# Patient Record
Sex: Female | Born: 1955 | Race: Asian | Hispanic: No | Marital: Married | Smoking: Never smoker
Health system: Southern US, Community
[De-identification: ages and names within clinical notes are randomized; demographics above are authoritative.]

## PROBLEM LIST (undated history)

## (undated) DIAGNOSIS — D649 Anemia, unspecified: Secondary | ICD-10-CM

## (undated) DIAGNOSIS — I10 Essential (primary) hypertension: Secondary | ICD-10-CM

## (undated) DIAGNOSIS — M25562 Pain in left knee: Secondary | ICD-10-CM

## (undated) DIAGNOSIS — M25561 Pain in right knee: Secondary | ICD-10-CM

## (undated) HISTORY — DX: Pain in right knee: M25.562

## (undated) HISTORY — PX: NO PAST SURGERIES: SHX2092

## (undated) HISTORY — DX: Anemia, unspecified: D64.9

## (undated) HISTORY — DX: Pain in right knee: M25.561

---

## 2010-07-05 ENCOUNTER — Emergency Department (HOSPITAL_COMMUNITY)
Admission: EM | Admit: 2010-07-05 | Discharge: 2010-07-05 | Payer: Self-pay | Source: Home / Self Care | Admitting: Emergency Medicine

## 2013-10-16 ENCOUNTER — Encounter (HOSPITAL_COMMUNITY): Payer: Self-pay | Admitting: Emergency Medicine

## 2013-10-16 ENCOUNTER — Emergency Department (HOSPITAL_COMMUNITY)
Admission: EM | Admit: 2013-10-16 | Discharge: 2013-10-16 | Disposition: A | Discharge status: Home / Self Care | Payer: Self-pay | Source: Home / Self Care | Attending: Emergency Medicine | Admitting: Emergency Medicine

## 2013-10-16 DIAGNOSIS — I10 Essential (primary) hypertension: Secondary | ICD-10-CM

## 2013-10-16 HISTORY — DX: Essential (primary) hypertension: I10

## 2013-10-16 LAB — COMPREHENSIVE METABOLIC PANEL
ALT: 8 U/L (ref 0–35)
Albumin: 4.4 g/dL (ref 3.5–5.2)
Alkaline Phosphatase: 66 U/L (ref 39–117)
CO2: 29 mEq/L (ref 19–32)
Creatinine, Ser: 0.78 mg/dL (ref 0.50–1.10)
GFR calc Af Amer: >90 mL/min (ref >90)
GFR calc non Af Amer: >90 mL/min (ref >90)
Glucose, Bld: 87 mg/dL (ref 70–99)
Sodium: 139 mEq/L (ref 135–145)
Total Bilirubin: 0.9 mg/dL (ref 0.3–1.2)

## 2013-10-16 LAB — CBC WITH DIFFERENTIAL/PLATELET
Basophils Relative: 0 % (ref 0–1)
HCT: 36.6 % (ref 36.0–46.0)
MCH: 25.3 pg — ABNORMAL LOW (ref 26.0–34.0)
Neutro Abs: 3.5 10*3/uL (ref 1.7–7.7)
RBC: 4.91 MIL/uL (ref 3.87–5.11)

## 2013-10-16 LAB — POCT H PYLORI SCREEN: H. PYLORI SCREEN, POC: NEGATIVE

## 2013-10-16 NOTE — ED Notes (Addendum)
Via 57 y/o daughter, Lenn Cal, and Runner, broadcasting/film/video of pt... Pt c/o HTN Reports she was referred to [place] by the [facility]reg[facility] Nurse for HBP BP today is 197/109... Sxs today include: BV, abd pain, HA Denies: CP, SOB, weakness, diaphoresis, f/v/n/d.... No PCP Alert w/no signs of acute distress.

## 2013-10-16 NOTE — ED Provider Notes (Signed)
CSN: [id number]     Arrival date & time 10/16/13  1231 History   First MD Initiated Contact with Patient 10/16/13 1348     Chief Complaint  Patient presents with  . Hypertension   (Consider location/radiation/quality/duration/timing/severity/associated sxs/prior Treatment) HPI Comments: 57 year old non-English-speaking female presents for evaluation of hypertension. She saw a Engineer, water today who checked her blood pressure and then sent her here. Today, she is complaining of headache, abdominal pain, blurry vision. These have all been present for over a year and have not changed at all recently. No other symptoms   Past Medical History  Diagnosis Date  . Hypertension    History reviewed. No pertinent past surgical history. No family history on file. History  Substance Use Topics  . Smoking status: Never Smoker   . Smokeless tobacco: Not on file  . Alcohol Use: No   OB History   Grav Para Term Preterm Abortions TAB SAB Ect Mult Living                 Review of Systems  Constitutional: Negative for fever and chills.  Eyes: Positive for visual disturbance.  Respiratory: Negative for cough and shortness of breath.   Cardiovascular: Negative for chest pain, palpitations and leg swelling.  Gastrointestinal: Positive for abdominal pain. Negative for nausea and vomiting.  Endocrine: Negative for polydipsia and polyuria.  Genitourinary: Negative for dysuria, urgency and frequency.  Musculoskeletal: Negative for arthralgias and myalgias.  Skin: Negative for rash.  Neurological: Positive for headaches. Negative for dizziness, weakness and light-headedness.    Allergies  Review of patient's allergies indicates no known allergies.  Home Medications  No current outpatient prescriptions on file. BP 197/109  Pulse 69  Temp(Src) 98.3 F (36.8 C) (Oral)  Resp 16  SpO2 100% Physical Exam  Nursing note and vitals reviewed. Constitutional: She is oriented to person, place,  and time. Vital signs are normal. She appears well-developed and well-nourished. No distress.  HENT:  Head: Normocephalic and atraumatic.  Eyes: Scleral icterus is present.  Cardiovascular: Normal rate, regular rhythm and normal heart sounds.   Pulmonary/Chest: Effort normal and breath sounds normal. No respiratory distress. She has no wheezes. She has no rales.  Abdominal: Soft.  Neurological: She is alert and oriented to person, place, and time. She has normal strength. No cranial nerve deficit. Coordination normal.  Skin: Skin is warm and dry. No rash noted. She is not diaphoretic.  Psychiatric: She has a normal mood and affect. Judgment normal.    ED Course  Procedures (including critical care time) Labs Review Labs Reviewed  CBC WITH DIFFERENTIAL  COMPREHENSIVE METABOLIC PANEL  POCT H PYLORI SCREEN   Imaging Review No results found.    MDM   1. Hypertension    Blood pressure is very high the patient is comfortable, in no distress. She is not sick at this time, this is a normal day for her. No signs of hypertensive urgency or emergency. The best thing we can do for this patient at this time is get her set up with primary care. I provided explicit instructions, call community health and wellness this afternoon to make an appointment. CBC and CMP sent, we will call back for cautery external nurse who will get in touch with this patient's family if the labs are abnormal and require anything other than followup with primary care.      Graylon Good, PA-C 10/16/13 1521

## 2013-10-16 NOTE — ED Provider Notes (Signed)
Medical screening examination/treatment/procedure(s) were performed by non-physician practitioner and as supervising physician I was immediately available for consultation/collaboration.  Jahmir Salo, M.D.  Khrystian Schauf C Eleftheria Taborn, MD 10/16/13 1914 

## 2013-10-28 ENCOUNTER — Ambulatory Visit (INDEPENDENT_AMBULATORY_CARE_PROVIDER_SITE_OTHER): Payer: Self-pay | Admitting: Family Medicine

## 2013-10-28 VITALS — BP 135/88 | HR 68 | Temp 98.1°F | Ht 61.25 in | Wt 106.0 lb

## 2013-10-28 DIAGNOSIS — I10 Essential (primary) hypertension: Secondary | ICD-10-CM

## 2013-10-28 DIAGNOSIS — R519 Headache, unspecified: Secondary | ICD-10-CM | POA: Insufficient documentation

## 2013-10-28 DIAGNOSIS — R51 Headache: Secondary | ICD-10-CM | POA: Insufficient documentation

## 2013-10-28 DIAGNOSIS — K3189 Other diseases of stomach and duodenum: Secondary | ICD-10-CM

## 2013-10-28 DIAGNOSIS — R109 Unspecified abdominal pain: Secondary | ICD-10-CM | POA: Insufficient documentation

## 2013-10-28 MED ORDER — CLONIDINE HCL 0.1 MG PO TABS
0.2000 mg | ORAL_TABLET | Freq: Once | ORAL | Status: AC
  Administered 2013-10-28: 0.2 mg via ORAL

## 2013-10-28 MED ORDER — OMEPRAZOLE 40 MG PO CPDR: 40.0000 mg | DELAYED_RELEASE_CAPSULE | Freq: Every day | ORAL | Status: DC

## 2013-10-28 MED ORDER — LISINOPRIL 5 MG PO TABS: 5.0000 mg | ORAL_TABLET | Freq: Every day | ORAL | Status: DC

## 2013-10-28 NOTE — Assessment & Plan Note (Addendum)
BP today SBP 190-220 and DBP 100-130 SYmptomatic w/ HA Longstanding untreated HTN Labs reviewed CLonidine 0.2mg  in office today w/ excellent reduction in BP (likely naive)  BP Readings from Last 3 Encounters:  10/28/13 135/88  10/16/13 197/109   Starting lisinopril 5mg   Recheck BMET in 1 month Likely to need multiple agents to control

## 2013-10-28 NOTE — Assessment & Plan Note (Signed)
Likely mixed picture given fmhx and length of HA. Migraines likely exacerbated by HTN No evidence for intracranial process at this time BP control Tylenol 1000mg  QID PRN

## 2013-10-28 NOTE — Patient Instructions (Addendum)
You are doing very well overall. We need to get your blood pressure under control as this likely is making your headache worse.  Please try tylenol 1000mg  up to 4 times a day for your headache Please try the prilosec for 2 weeks for your stomach pain Please return in 4 weeks for your next visit

## 2013-10-28 NOTE — Assessment & Plan Note (Signed)
Likely related to PUD given high stress after moving to [place] and symptomatic w/ acidic/hot foods  HPylori neg  2 wk trial of Prilosec.

## 2013-10-28 NOTE — Progress Notes (Addendum)
Laura Harris is a 57 y.o. female who presents to [facility] today for initiation of care to [facility]:    Presents with friend from the community and interpreter.  Health screening revealed very high blood pressure.  Never been treated.    BP: Couldn't afford any medications for her BP. Has never taken medications for her BP. Never checks BP outside of a MD office. Previous medical records indicate nml pressure in 2011 but elevated to >160/120 in November 2014. Currently moving, financially strapped, and very concerned about current living situation.   Stomach discomfort:daily BM. Worse after spicy foods and coffee. Recent H.Pylori test negative. Denies hemetemesis and hematochezia.   HA: ASA 650 x 2 days for HA. Chronic condition for pt but now occuring w/ greater frequency. Occur approximately daily since 2011 when arrived in [place]. Previous episodes of daily HA in [place] which resolve. Endorses photosensitivity. Denies phonosensitivity. Associated w/ blurry vision. ASA 650mg  w/o benefit. Wakes up at night. Denies seizures and LOC.  The following portions of the patient's history were reviewed and updated as appropriate: allergies, current medications, past medical history, family and social history, and problem list.  Patient is a nonsmoker.  Past Medical History  Diagnosis Date  . Hypertension     ROS as above otherwise neg.    Medications reviewed. Current Outpatient Prescriptions  Medication Sig Dispense Refill  . aspirin 325 MG tablet Take 650 mg by mouth daily.      Marland Kitchen lisinopril (PRINIVIL,ZESTRIL) 5 MG tablet Take 1 tablet (5 mg total) by mouth daily.  90 tablet  3  . omeprazole (PRILOSEC) 40 MG capsule Take 1 capsule (40 mg total) by mouth daily.  14 capsule  0   No current facility-administered medications for this visit.    Exam: BP 135/88  Pulse 68  Temp(Src) 98.1 F (36.7 C) (Oral)  Ht 5' 1.25" (1.556 m)  Wt 106 lb (48.081 kg)  BMI 19.86 kg/m2 Gen: Well NAD HEENT: EOMI,   MMM, optic disc margins sharp, retinal arteries w/o pulsation.  Lungs: CTABL Nl WOB Heart: RRR no MRG Abd: NABS, NT, ND Exts: Non edematous BL  LE, warm and well perfused.   No results found for this or any previous visit (from the past 72 hour(s)).  A/P (as seen in Problem list)  Malignant hypertension BP today SBP 190-220 and DBP 100-130 SYmptomatic w/ HA Longstanding untreated HTN Labs reviewed CLonidine 0.2mg  in office today w/ excellent reduction in BP (likely naive)  BP Readings from Last 3 Encounters:  10/28/13 135/88  10/16/13 197/109   Starting lisinopril 5mg   Recheck BMET in 1 month Likely to need multiple agents to control   Stomach pain Likely related to PUD given high stress after moving to [place] and symptomatic w/ acidic/hot foods  HPylori neg  2 wk trial of Prilosec.  Headache(784.0) Likely mixed picture given fmhx and length of HA. Migraines likely exacerbated by HTN No evidence for intracranial process at this time BP control Tylenol 1000mg  QID PRN

## 2013-11-04 ENCOUNTER — Encounter: Payer: Self-pay | Admitting: Family Medicine

## 2013-11-08 ENCOUNTER — Ambulatory Visit: Payer: Self-pay | Admitting: Internal Medicine

## 2013-11-25 ENCOUNTER — Ambulatory Visit (INDEPENDENT_AMBULATORY_CARE_PROVIDER_SITE_OTHER): Payer: Self-pay | Admitting: Family Medicine

## 2013-11-25 VITALS — BP 160/78 | HR 60 | Temp 97.8°F | Ht 62.0 in | Wt 104.7 lb

## 2013-11-25 DIAGNOSIS — K3189 Other diseases of stomach and duodenum: Secondary | ICD-10-CM

## 2013-11-25 DIAGNOSIS — R1013 Epigastric pain: Secondary | ICD-10-CM

## 2013-11-25 DIAGNOSIS — R109 Unspecified abdominal pain: Secondary | ICD-10-CM

## 2013-11-25 DIAGNOSIS — I10 Essential (primary) hypertension: Secondary | ICD-10-CM

## 2013-11-25 DIAGNOSIS — R51 Headache: Secondary | ICD-10-CM

## 2013-11-25 LAB — BASIC METABOLIC PANEL
BUN: 10 mg/dL (ref 6–23)
CALCIUM: 9.3 mg/dL (ref 8.4–10.5)
CO2: 30 meq/L (ref 19–32)
Chloride: 100 mEq/L (ref 96–112)
Creat: 0.86 mg/dL (ref 0.50–1.10)
Glucose, Bld: 81 mg/dL (ref 70–99)
Potassium: 3.8 mEq/L (ref 3.5–5.3)
Sodium: 140 mEq/L (ref 135–145)

## 2013-11-25 MED ORDER — LISINOPRIL 20 MG PO TABS: 20.0000 mg | ORAL_TABLET | Freq: Every day | ORAL | Status: DC

## 2013-11-25 MED ORDER — OMEPRAZOLE 40 MG PO CPDR: 40.0000 mg | DELAYED_RELEASE_CAPSULE | Freq: Every day | ORAL | Status: DC

## 2013-11-25 NOTE — Patient Instructions (Signed)
Thank you for coming in today Please start taking 20mg  of lisinopril for your blood pressure (you may take 4, 5mg  tabs or 1, 20mg  tab) if you become dizzy and lightheaded then decrease your dose to 10mg  Please take up to 1,000mg  of tylenol every 6-8 hours for your headache Please take the prilosec daily for your stomach pain Please make sure to eat several small meals daily.

## 2013-11-25 NOTE — Assessment & Plan Note (Signed)
Much improved on lisinopril 5mg  Qday but still not at goal BMET today to evaluate renal fxn Increase to lisinopril 20mg   Pt w/ strict instructions to increase more slowly if becomes symptomatic on 20 (try 10mg  instead) Will need to add additional agent if still not controlled

## 2013-11-25 NOTE — Assessment & Plan Note (Signed)
Likely related to GERD as symptoms improved then returned after treatment w/ PPI Restart Prilosec Pt counseled to not skip meals and to eat more small meals daiily

## 2013-11-25 NOTE — Progress Notes (Signed)
Laura Harris is a 58 y.o. female who [facility] today for immigrant clinic for f/u on HTN, HA, and stomach pain  HTN: Taking Lisinopril as prescribed. Initially felt light headed when taking but no longer lightheaded. Denies CP, Palpitations, SOB.   HA: comes and goes. Last most of the day. Taking ASA 650mg  BID. Not taking tylenol. HA improving after starting lisinopril. Denies any loss of bowel or bladder, seizures, one sided weakness, falls.   Stomach pain: continue to have stomach pain. Took prilosec for 14 day trial period w/ relief but symptoms returned after stopping. Spicy foods worsen symptoms.   The following portions of the patient's history were reviewed and updated as appropriate: allergies, current medications, past medical history, family and social history, and problem list.  Past Medical History  Diagnosis Date  . Hypertension   . Bilateral knee pain     ROS as above otherwise neg.    Medications reviewed. Current Outpatient Prescriptions  Medication Sig Dispense Refill  . aspirin 325 MG tablet Take 650 mg by mouth daily.      Marland Kitchen. lisinopril (PRINIVIL,ZESTRIL) 20 MG tablet Take 1 tablet (20 mg total) by mouth daily.  90 tablet  3  . omeprazole (PRILOSEC) 40 MG capsule Take 1 capsule (40 mg total) by mouth daily.  90 capsule  3   No current facility-administered medications for this visit.    Exam:  BP 168/95  Pulse 63  Temp(Src) 97.8 F (36.6 C) (Oral)  Ht 5\' 2"  (1.575 m)  Wt 104 lb 11.2 oz (47.492 kg)  BMI 19.15 kg/m2 Gen: Well NAD HEENT: EOMI,  MMM Lungs: CTABL Nl WOB Heart: RRR II/VI systolic murmur Abd: NABS, NT, ND Exts: Non edematous BL  LE, warm and well perfused.   No results found for this or any previous visit (from the past 72 hour(s)).  A/P (as seen in Problem list)  Headache(784.0) Multifactorial - Improving w/ BP control.  Continue ASA 650mg  BID, Start tylenol and ibuprofen PRN Renal/liver fxn nml   Hypertension Much improved on  lisinopril 5mg  Qday but still not at goal BMET today to evaluate renal fxn Increase to lisinopril 20mg   Pt w/ strict instructions to increase more slowly if becomes symptomatic on 20 (try 10mg  instead) Will need to add additional agent if still not controlled   Stomach pain Likely related to GERD as symptoms improved then returned after treatment w/ PPI Restart Prilosec Pt counseled to not skip meals and to eat more small meals daiily

## 2013-11-25 NOTE — Assessment & Plan Note (Addendum)
Multifactorial - Improving w/ BP control.  Continue ASA 650mg  BID, Start tylenol and ibuprofen PRN Renal/liver fxn nml

## 2013-12-30 ENCOUNTER — Ambulatory Visit: Payer: Self-pay

## 2014-01-06 ENCOUNTER — Ambulatory Visit: Payer: Self-pay

## 2014-02-14 ENCOUNTER — Ambulatory Visit: Payer: No Typology Code available for payment source | Attending: Internal Medicine

## 2014-02-18 ENCOUNTER — Ambulatory Visit: Payer: Self-pay

## 2014-02-24 ENCOUNTER — Ambulatory Visit: Payer: No Typology Code available for payment source

## 2014-03-11 ENCOUNTER — Encounter: Payer: Self-pay | Admitting: Family Medicine

## 2014-03-11 ENCOUNTER — Telehealth: Payer: Self-pay | Admitting: Family Medicine

## 2014-03-11 ENCOUNTER — Ambulatory Visit (INDEPENDENT_AMBULATORY_CARE_PROVIDER_SITE_OTHER): Payer: No Typology Code available for payment source | Admitting: Family Medicine

## 2014-03-11 VITALS — BP 116/74 | HR 64 | Temp 97.9°F | Ht 62.0 in | Wt 99.0 lb

## 2014-03-11 DIAGNOSIS — R300 Dysuria: Secondary | ICD-10-CM

## 2014-03-11 DIAGNOSIS — N390 Urinary tract infection, site not specified: Secondary | ICD-10-CM

## 2014-03-11 DIAGNOSIS — R1013 Epigastric pain: Secondary | ICD-10-CM

## 2014-03-11 DIAGNOSIS — R109 Unspecified abdominal pain: Secondary | ICD-10-CM

## 2014-03-11 DIAGNOSIS — E041 Nontoxic single thyroid nodule: Secondary | ICD-10-CM

## 2014-03-11 DIAGNOSIS — K3189 Other diseases of stomach and duodenum: Secondary | ICD-10-CM

## 2014-03-11 LAB — POCT URINALYSIS DIPSTICK
BILIRUBIN UA: NEGATIVE
GLUCOSE UA: NEGATIVE
Ketones, UA: NEGATIVE
NITRITE UA: NEGATIVE
PH UA: 5
PROTEIN UA: NEGATIVE
Spec Grav, UA: <=1.005
UROBILINOGEN UA: 0.2

## 2014-03-11 NOTE — Telephone Encounter (Signed)
Interpreter called for Laura Harris because they said that Dr. Gwendolyn GrantWalden was calling in a antibiotic for her. jw

## 2014-03-11 NOTE — Telephone Encounter (Signed)
Please call Marylin when prescription is at the pharmacy since she has to come from the other side of town to pick it up for [employer]. [phone] jw

## 2014-03-12 DIAGNOSIS — E041 Nontoxic single thyroid nodule: Secondary | ICD-10-CM | POA: Insufficient documentation

## 2014-03-12 DIAGNOSIS — N390 Urinary tract infection, site not specified: Secondary | ICD-10-CM | POA: Insufficient documentation

## 2014-03-12 LAB — TSH: TSH: 0.61 u[IU]/mL (ref 0.350–4.500)

## 2014-03-12 MED ORDER — CEPHALEXIN 500 MG PO CAPS: 500.0000 mg | ORAL_CAPSULE | Freq: Four times a day (QID) | ORAL | Status: DC

## 2014-03-12 NOTE — Telephone Encounter (Signed)
Filled prescription.  Will touch base with Vanita PandaMarilynn James later today.

## 2014-03-12 NOTE — Assessment & Plan Note (Addendum)
Treating this as thryoid nodule.  With associated fatigue and anorexia. Possibly also enlarged lymph node.   The big catch is her insurance -- she has none. Checking TSH today, but she will need some throat imaging as well. Patient's friend Alfredia ClientMarilynn states they have sent in all the [employer] info but have not yet heard back. Has no insurance otherwise nor means to pay for imaging studies.   Will touch base with Abundio MiuBarbara McGregor and call [name] with results.

## 2014-03-12 NOTE — Assessment & Plan Note (Signed)
Treat based on UA and sx's. Keflex

## 2014-03-12 NOTE — Progress Notes (Signed)
Subjective:    Laura Harris is a 58 y.o. female who [facility] as an established [facility].  Entire interview conducted with [place] interpreter present, as well as friend who is teaching patient ESL Vanita PandaMarilynn James.  Phone number [phone]:  1.  Throat mass:  Present for about 1 week.  Painful "knot" on right side of throat.  Denies any dysphagia but does endorse some odynophagia.  Chronic dental pain which is not acutely worsened.  No fevers, chills, night sweats.  Has noted increasing fatigue for past week.  Also with weight loss/anorexia.  (has lost about 7 lbs since [place] of last year, 5 since January).    2.  Dysuria/nocturia:  Has had chronic nocturia x 1 for past several years if not longer.  This past week, has had nocturia x 3 nightly.  Denies any dysuria.  Does endorse frequency during the day as well.  Right-sided back pain, also present for past week or so.    ROS as above per HPI, otherwise neg.  Mild nausea but not vomiting.  No change in bowel habits.    The following portions of the patient's history were reviewed and updated as appropriate: allergies, current medications, past medical history, family and social history, and problem list. Patient is a nonsmoker.    PMH reviewed.  Past Medical History  Diagnosis Date  . Hypertension   . Bilateral knee pain    No past surgical history on file.  Medications reviewed. Current Outpatient Prescriptions  Medication Sig Dispense Refill  . aspirin 325 MG tablet Take 650 mg by mouth daily.      . cephALEXin (KEFLEX) 500 MG capsule Take 1 capsule (500 mg total) by mouth 4 (four) times daily. X 7 days  28 capsule  0  . lisinopril (PRINIVIL,ZESTRIL) 20 MG tablet Take 1 tablet (20 mg total) by mouth daily.  90 tablet  3  . omeprazole (PRILOSEC) 40 MG capsule Take 1 capsule (40 mg total) by mouth daily.  90 capsule  3   No current facility-administered medications for this visit.     Objective:   Physical Exam BP  116/74  Pulse 64  Temp(Src) 97.9 F (36.6 C) (Oral)  Ht 5\' 2"  (1.575 m)  Wt 99 lb (44.906 kg)  BMI 18.10 kg/m2 Gen:  Alert, cooperative patient who appears stated age in no acute distress.  Vital signs reviewed. HEENT: EOMI,  MMM Neck:  1 cm nodule noted Right neck, what appears to be superior aspect of right lobe of thryoid.  Fairly fixed.  TTP.  Non-fluctuant.  No thryomegaly.  Trachea midline.  No overlying rash/redness of neck.   Cardiac:  Regular rate and rhythm without murmur auscultated.  Good S1/S2. Pulm:  Clear to auscultation bilaterally with good air movement.  No wheezes or rales noted.   Abd:  Soft/nondistended/nontender throughout Exts: Thin

## 2014-03-13 LAB — URINE CULTURE

## 2014-03-13 NOTE — Addendum Note (Signed)
Addended byGwendolyn Grant: Jennel Mara, Newt LukesJEFFREY H on: 03/13/2014 02:52 PM   Modules accepted: Level of Service

## 2014-03-13 NOTE — Telephone Encounter (Signed)
[facility] and [facility].  She saw patient yesterday and today.  Laura Harris's coloring is "much better" and her appetite has increased.  Plan was initially to go ahead and schedule her for a thyroid ultrasound, but since she has greatly improved and both the patietn and her husband are very concerned about finances, will hold off on ultrasound at this point until I can see her again.  Asked her to touch base with us next week.

## 2014-03-17 ENCOUNTER — Ambulatory Visit (HOSPITAL_COMMUNITY): Payer: No Typology Code available for payment source

## 2014-04-02 ENCOUNTER — Encounter: Payer: Self-pay | Admitting: Sports Medicine

## 2014-04-02 ENCOUNTER — Ambulatory Visit (INDEPENDENT_AMBULATORY_CARE_PROVIDER_SITE_OTHER): Payer: No Typology Code available for payment source | Admitting: Sports Medicine

## 2014-04-02 VITALS — BP 155/92 | HR 65 | Temp 98.3°F | Wt 101.9 lb

## 2014-04-02 DIAGNOSIS — E041 Nontoxic single thyroid nodule: Secondary | ICD-10-CM

## 2014-04-02 DIAGNOSIS — I10 Essential (primary) hypertension: Secondary | ICD-10-CM

## 2014-04-02 LAB — COMPREHENSIVE METABOLIC PANEL
ALBUMIN: 4.2 g/dL (ref 3.5–5.2)
ALK PHOS: 50 U/L (ref 39–117)
ALT: 11 U/L (ref 0–35)
AST: 16 U/L (ref 0–37)
BILIRUBIN TOTAL: 0.8 mg/dL (ref 0.2–1.2)
BUN: 14 mg/dL (ref 6–23)
CHLORIDE: 104 meq/L (ref 96–112)
CO2: 27 meq/L (ref 19–32)
CREATININE: 1.04 mg/dL (ref 0.50–1.10)
Calcium: 8.8 mg/dL (ref 8.4–10.5)
Glucose, Bld: 82 mg/dL (ref 70–99)
POTASSIUM: 3.9 meq/L (ref 3.5–5.3)
Sodium: 139 mEq/L (ref 135–145)
TOTAL PROTEIN: 6.9 g/dL (ref 6.0–8.3)

## 2014-04-02 LAB — LIPID PANEL
Cholesterol: 164 mg/dL (ref 0–200)
HDL: 50 mg/dL (ref >39)
LDL CALC: 96 mg/dL (ref 0–99)
TRIGLYCERIDES: 91 mg/dL (ref <150)
Total CHOL/HDL Ratio: 3.3 Ratio
VLDL: 18 mg/dL (ref 0–40)

## 2014-04-02 LAB — CBC
HCT: 29 % — ABNORMAL LOW (ref 36.0–46.0)
Hemoglobin: 9.5 g/dL — ABNORMAL LOW (ref 12.0–15.0)
MCH: 24.9 pg — ABNORMAL LOW (ref 26.0–34.0)
MCHC: 32.8 g/dL (ref 30.0–36.0)
MCV: 76.1 fL — AB (ref 78.0–100.0)
Platelets: 121 10*3/uL — ABNORMAL LOW (ref 150–400)
RBC: 3.81 MIL/uL — AB (ref 3.87–5.11)
RDW: 13.3 % (ref 11.5–15.5)
WBC: 3.8 10*3/uL — AB (ref 4.0–10.5)

## 2014-04-02 LAB — T4, FREE: FREE T4: 1.2 ng/dL (ref 0.80–1.80)

## 2014-04-02 LAB — TSH: TSH: 0.649 u[IU]/mL (ref 0.350–4.500)

## 2014-04-02 MED ORDER — LISINOPRIL 20 MG PO TABS: 20.0000 mg | ORAL_TABLET | Freq: Every day | ORAL | Status: DC

## 2014-04-02 NOTE — Patient Instructions (Addendum)
Thyroid Ultrasound.   Follow up with Dr. Gwendolyn GrantWalden.

## 2014-04-02 NOTE — Progress Notes (Deleted)
HPI  Physical Exam   

## 2014-04-02 NOTE — Assessment & Plan Note (Signed)
Chronic condition - Off meds 1. Refill lisinopril > Follow up BP at next visit .

## 2014-04-02 NOTE — Assessment & Plan Note (Signed)
Subacute condition - Normal TSH reassuring but presence of thyroid bruit concerning for hyperfunctioning.  Unclear as to why would be painful as these are usually pain free. 1. Start with Thyroid US 2. Recheck Thyroid Labs including free T4 3. Discussed naproxen bid prn for pain > Follow up with Dr. Gwendolyn GrantWalden  .

## 2014-04-07 ENCOUNTER — Ambulatory Visit (HOSPITAL_COMMUNITY)
Admission: RE | Admit: 2014-04-07 | Discharge: 2014-04-07 | Disposition: A | Discharge status: Home / Self Care | Payer: No Typology Code available for payment source | Source: Ambulatory Visit | Attending: Family Medicine | Admitting: Family Medicine

## 2014-04-07 DIAGNOSIS — E042 Nontoxic multinodular goiter: Secondary | ICD-10-CM | POA: Insufficient documentation

## 2014-04-07 DIAGNOSIS — R221 Localized swelling, mass and lump, neck: Secondary | ICD-10-CM

## 2014-04-07 DIAGNOSIS — R220 Localized swelling, mass and lump, head: Secondary | ICD-10-CM | POA: Insufficient documentation

## 2014-04-07 NOTE — Progress Notes (Signed)
Laura Harris - 58 y.o. female MRN [id number]  Date of birth: 04/19/1956  CC, SUBJECTIVE & ROS:     If applicable, see problem based charting for additional problem specific documentation. HPI Comments: Patient presents with:   Neck Pain - lump in neck x 3months, improved while on ABX for UTI.  Recent TSH was within normal limits but no further workup was done at that time due to lack of insurance.  She reports the unless she experiences in the right side of her neck decreased mildly while on antibiotics for a UTI.  She reports odynophagia without dysphasia.  Denies tachycardia palpitations, thinning of skin, significant weight loss although a 7 pound weight loss was seen in the previous 4 months.  2 pound weight gain today.  Patient denies any nausea, vomiting.  No change in her bowel habits; no melena, no hematochezia.  Previous dysuria she was experiencing has resolved.    Hypertension - out of meds.  Pt denies chest pain, dyspnea at rest or exertion, PND, lower extremity edema.  Patient denies any facial asymmetry, unilateral weakness, or dysarthria.   HISTORY: Past Medical, Surgical, Social, and Family History Reviewed & Updated per EMR.  Pertinent Historical Findings include: Refugee, [place], history of hypertension, not up to date on screening tests including Pap, colonoscopy.  [facility]  Recently seen for a throat mass and urinary tract infection.  Recent TSH normal.  Nonsmoker  BP Readings from Last 5 Encounters:  04/02/14 155/92  03/11/14 116/74  11/25/13 160/78  10/28/13 135/88  10/16/13 197/109   Wt Readings from Last 5 Encounters:  04/02/14 101 lb 14.4 oz (46.222 kg)  03/11/14 99 lb (44.906 kg)  11/25/13 104 lb 11.2 oz (47.492 kg)  10/28/13 106 lb (48.081 kg)     OBJECTIVE:  BP:155/92 mmHg  HR:65bpm  TEMP:98.3 F (36.8 C)(Oral)  RESP:   HT:    WT:101 lb 14.4 oz (46.222 kg)  BMI:  Physical Exam  Vitals reviewed. Constitutional: She is  well-developed, well-nourished, and in no distress.  HENT:  Head: Normocephalic and atraumatic.  Right Ear: External ear normal.  Left Ear: External ear normal.  Eyes: Conjunctivae and EOM are normal. Pupils are equal, round, and reactive to light. Right eye exhibits no discharge. Left eye exhibits no discharge. No scleral icterus.  Neck: Normal range of motion. Neck supple. No JVD present. No tracheal deviation present. Thyromegaly (Right-sided thyroid nodule, positive bruit over nodule) present.  No paracervical muscle spasm  Cardiovascular: Normal rate, regular rhythm and normal heart sounds.  Exam reveals no gallop and no friction rub.   No murmur heard. Pulmonary/Chest: Effort normal and breath sounds normal. No stridor. No respiratory distress. She has no wheezes. She exhibits no tenderness.  Abdominal: Soft. She exhibits no distension. There is no tenderness. There is no rebound and no guarding.  Musculoskeletal: She exhibits no edema and no tenderness.  Lymphadenopathy:    She has no cervical adenopathy.  Neurological: She is alert.  Moves all 4 extremities spontaneously; no lateralization.  Skin: Skin is warm and dry. She is not diaphoretic.  Psychiatric: Mood, memory, affect and judgment normal.    MEDICATIONS, LABS & OTHER ORDERS: Previous Medications   ASPIRIN 325 MG TABLET    Take 650 mg by mouth daily.   CEPHALEXIN (KEFLEX) 500 MG CAPSULE    Take 1 capsule (500 mg total) by mouth 4 (four) times daily. X 7 days   OMEPRAZOLE (PRILOSEC) 40 MG CAPSULE  Take 1 capsule (40 mg total) by mouth daily.   Modified Medications   Modified Medication Previous Medication   LISINOPRIL (PRINIVIL,ZESTRIL) 20 MG TABLET lisinopril (PRINIVIL,ZESTRIL) 20 MG tablet      Take 1 tablet (20 mg total) by mouth daily.    Take 1 tablet (20 mg total) by mouth daily.   New Prescriptions   No medications on file   Discontinued Medications   No medications on file   Orders Placed This Encounter    Procedures  . US Soft Tissue Head/Neck  . TSH  . Comprehensive metabolic panel  . CBC  . Lipid panel  . T4, free   ASSESSMENT & PLAN: See problem based charting & AVS for pt instructions.

## 2014-04-09 ENCOUNTER — Telehealth: Payer: Self-pay | Admitting: Sports Medicine

## 2014-04-09 DIAGNOSIS — E041 Nontoxic single thyroid nodule: Secondary | ICD-10-CM

## 2014-04-09 NOTE — Telephone Encounter (Signed)
Patient needs to be scheduled for a thyroid biopsy.  Given the language barrier if there are any concerns it may be best if she come in to discuss the results with Dr. Gwendolyn GrantWalden but I would prefer her to get the test prior to seeing Dr. Gwendolyn GrantWalden.  I have placed an order for ultrasound-guided thyroid biopsy.  If patient agreeable to schedule this and a followup with Dr. Gwendolyn GrantWalden 3-4 days following the biopsy (to allow time for resulting).

## 2014-04-16 NOTE — Telephone Encounter (Signed)
Left message with young family member to have an Adult that speaks [place] and Authurized to return my call.Amedeo Gory S Destry Dauber

## 2014-04-23 ENCOUNTER — Ambulatory Visit (HOSPITAL_COMMUNITY): Admission: RE | Admit: 2014-04-23 | Payer: No Typology Code available for payment source | Source: Ambulatory Visit

## 2015-01-26 ENCOUNTER — Ambulatory Visit: Payer: Self-pay

## 2015-01-26 ENCOUNTER — Encounter: Payer: Self-pay | Admitting: Family Medicine

## 2015-01-26 DIAGNOSIS — I10 Essential (primary) hypertension: Secondary | ICD-10-CM

## 2015-01-26 MED ORDER — LISINOPRIL 20 MG PO TABS: 20.0000 mg | ORAL_TABLET | Freq: Every day | ORAL | Status: DC

## 2015-01-26 NOTE — Progress Notes (Signed)
Patient is wanting her prescription for Lisinopril (bp meds) sent to the health dept.  She has one refill left on current prescription.

## 2015-01-26 NOTE — Progress Notes (Signed)
Completed and signed using the "fax" option.

## 2015-04-06 ENCOUNTER — Other Ambulatory Visit: Payer: Self-pay | Admitting: *Deleted

## 2015-04-06 DIAGNOSIS — I10 Essential (primary) hypertension: Secondary | ICD-10-CM

## 2015-04-07 MED ORDER — LISINOPRIL 20 MG PO TABS: 20.0000 mg | ORAL_TABLET | Freq: Every day | ORAL | Status: DC

## 2015-11-17 ENCOUNTER — Encounter: Payer: Self-pay | Admitting: Internal Medicine

## 2015-11-17 ENCOUNTER — Ambulatory Visit (INDEPENDENT_AMBULATORY_CARE_PROVIDER_SITE_OTHER): Payer: Self-pay | Admitting: Internal Medicine

## 2015-11-17 VITALS — BP 210/100 | HR 68 | Resp 16 | Ht 62.0 in | Wt 104.0 lb

## 2015-11-17 DIAGNOSIS — I10 Essential (primary) hypertension: Secondary | ICD-10-CM

## 2015-11-17 DIAGNOSIS — Z7984 Long term (current) use of oral hypoglycemic drugs: Secondary | ICD-10-CM

## 2015-11-17 MED ORDER — LISINOPRIL 20 MG PO TABS: 20.0000 mg | ORAL_TABLET | Freq: Every day | ORAL | Status: DC

## 2015-11-17 NOTE — Progress Notes (Signed)
Pt arrived at office asking for application for [employer] renewal. Adella HareJadar is the language and so communication has been difficult. Through teacher we have learned that she did not qualify for assistance [facility] due to living in home with other adults who are working. However there is no support given and there is refusal by family to help her with any insurance or coverage of bills incurred for medical care. We have referred her to the [facility] to see if she can apply for [place]. [facility]. While getting paperwork ready, we took her BP. She is able to tell us the medication for hypertension ran out several months ago, but can not afford [facility] (Dr. Renold DonJeff Walden). BP was 210/100 sitting in the front lobby. Dr Delrae AlfredMulberry will see her before she leaves the center. Interpreter found for phone interpretation.

## 2015-11-17 NOTE — Patient Instructions (Signed)
Drink a glass of water before every meal Drink 6-8 glasses of water daily Eat three meals daily Eat a protein and healthy fat with every meal (eggs,fish, chicken, turkey and limit red meats) Eat 5 servings of vegetables daily, mix the colors Eat 2 servings of fruit daily with skin, if skin is edible Use smaller plates Put food/utensils down as you chew and swallow each bite Eat at a table with friends/family at least once daily, no TV Do not eat in front of the TV 

## 2015-11-17 NOTE — Progress Notes (Signed)
   Subjective:    Patient ID: Laura Harris, female    DOB: 10/16/1956, [age] y.o.   MRN: [id number]  HPI  Interpretation in [place] over phone with volunteer, Vung Ksor.  1.  Hypertension: Looks like last time filled was in May with 3 refills.  Has been without medication for at least 4 months per patient.   Looks like she was to be seen by doctor and did not follow up--missed appt., so no further refills. Did not realize what she was supposed to do with medication before and also, difficult for her to get to appts due to distance of clinic. Also, concerned with cost--states she was paying $200 per visit previously, but expired orange card shows she was at 100% of [employer], so should just pay $10 here.  Informed her of this. Also, discussed she will likely need to take the medicine for the rest of her life--treated, not cured. Appears she was controlled in April when still taking medication. No metabolic profile since 04/01/2014.        Review of Systems     Objective:   Physical Exam HEENT:  PERRL, EOMI, Discs sharp, no exudates or hemorrhages visualized.  Mild AV nicking.  TMs pearly gray, throat without injection Neck: Supple, no adenopathy, no thyromegaly Chest:  CTA CV:  RRR with Normal S1, S2, no S3, S4, or murmur appreciated.  Radial and DP pulses normal and equal LE: No edema       Assessment & Plan:  1.  Hypertension:  Long discussion regarding need for chronic treatment of Hypertension--will likely always need to take medication.  Also, discussed good diet and regular physical activity. Check CMP  2.  Dental concerns:  Discussed cannot refer until has up to date orange card.

## 2015-11-18 LAB — COMPREHENSIVE METABOLIC PANEL
A/G RATIO: 1.5 (ref 1.1–2.5)
ALBUMIN: 4.6 g/dL (ref 3.5–5.5)
ALK PHOS: 75 IU/L (ref 39–117)
ALT: 15 IU/L (ref 0–32)
AST: 22 IU/L (ref 0–40)
BILIRUBIN TOTAL: 0.8 mg/dL (ref 0.0–1.2)
BUN / CREAT RATIO: 18 (ref 9–23)
BUN: 16 mg/dL (ref 6–24)
CHLORIDE: 98 mmol/L (ref 96–106)
CO2: 27 mmol/L (ref 18–29)
Calcium: 9 mg/dL (ref 8.7–10.2)
Creatinine, Ser: 0.9 mg/dL (ref 0.57–1.00)
GFR calc non Af Amer: 71 mL/min/{1.73_m2} (ref >59)
GFR, EST AFRICAN AMERICAN: 82 mL/min/{1.73_m2} (ref >59)
Globulin, Total: 3.1 g/dL (ref 1.5–4.5)
Glucose: 69 mg/dL (ref 65–99)
POTASSIUM: 4.5 mmol/L (ref 3.5–5.2)
Sodium: 142 mmol/L (ref 134–144)
TOTAL PROTEIN: 7.7 g/dL (ref 6.0–8.5)

## 2015-11-22 DIAGNOSIS — N189 Chronic kidney disease, unspecified: Secondary | ICD-10-CM

## 2015-11-22 HISTORY — DX: Chronic kidney disease, unspecified: N18.9

## 2015-11-24 ENCOUNTER — Ambulatory Visit: Payer: Self-pay

## 2015-11-24 VITALS — BP 150/90 | HR 66

## 2015-11-24 DIAGNOSIS — I10 Essential (primary) hypertension: Secondary | ICD-10-CM

## 2015-12-02 NOTE — Progress Notes (Signed)
Telephone is not accepting calls at this moment

## 2015-12-09 NOTE — Progress Notes (Signed)
Called patient and contact number is not working. Letter will be sent out tomorrow morning.

## 2016-01-11 ENCOUNTER — Ambulatory Visit: Payer: Self-pay | Admitting: Internal Medicine

## 2016-02-01 ENCOUNTER — Ambulatory Visit (INDEPENDENT_AMBULATORY_CARE_PROVIDER_SITE_OTHER): Payer: Self-pay | Admitting: Internal Medicine

## 2016-02-01 ENCOUNTER — Encounter: Payer: Self-pay | Admitting: Internal Medicine

## 2016-02-01 VITALS — BP 130/86 | HR 70 | Temp 98.5°F | Ht 62.0 in | Wt 101.0 lb

## 2016-02-01 DIAGNOSIS — M542 Cervicalgia: Secondary | ICD-10-CM

## 2016-02-01 DIAGNOSIS — E042 Nontoxic multinodular goiter: Secondary | ICD-10-CM

## 2016-02-01 DIAGNOSIS — Z23 Encounter for immunization: Secondary | ICD-10-CM

## 2016-02-01 DIAGNOSIS — F439 Reaction to severe stress, unspecified: Secondary | ICD-10-CM

## 2016-02-01 DIAGNOSIS — Z79899 Other long term (current) drug therapy: Secondary | ICD-10-CM

## 2016-02-01 DIAGNOSIS — K029 Dental caries, unspecified: Secondary | ICD-10-CM

## 2016-02-01 DIAGNOSIS — Z658 Other specified problems related to psychosocial circumstances: Secondary | ICD-10-CM

## 2016-02-01 NOTE — Progress Notes (Signed)
   Subjective:    Patient ID: Omar PersonChin Kerrick, female    DOB: 05/31/1956, 60 y.o.   MRN: [id number]  HPI  Dennison MascotBetsy Renfrew, part of the [employer] here with patient today, she does cannot interpret.  1.  Essential Hypertension:  Has been taking Lisinopril, but appears she forgets at times with about 10 extra pills left in her box.  Apparently has difficulties on the weekends remembering when family is all home.  2.  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  May get hot a lot.  Not clear if having diarrhea.  Not clear if she is hungry a lot.  Hair breaks a lot.  Not clear if dry skin.  3.  Stomach upset:  If eats too much, upsets stomach.  Beef and pork seem to upset her stomach.  Spinach, egg, chicken, [place]key are okay.  Does have constipation at times. Has a stool every day.    4.  Pains on posterior shoulders--feels related to what she eats. Person who accompanies her today states there is some stress in the home with younger folks (her son and daughter in law) She does have a daughter, Dierdre SearlesLi who is in late teens and can interpret       Review of Systems     Objective:   Physical Exam  Thin woman in NAD HEENT:  PERRL, EOMI, TMs pearly gray, Dental decay noted again, throat without injection Neck:  Supple, No adenopathy.  No definitive nodule palpated, thyroid slightly generous, no thyroid bruit. Chest:  CTA CV:  RRR without murmur or rub, radial pulses normal and equal. Abd:  Mild epigastric tenderness, no rebound or peritoneal sign.  +BS, NO HSM or masses Hair and Skin texture appear normal MS:  Tender over bilateral traps to shoulders and up to nuchal ridge       Assessment & Plan:  1.  Essential HYpertension:  Controlled, even with missing medication about 10 times in past 3 months.  Encouraged eating something every morning when takes pills and may remind her to take the pills on the  weekend.  BMP today  2.  History of thyroid nodules.  Needs orange card for ultrasound.  I do not feel a discreet nodule at this time.  TSH today.  3.  Neck Muscle Spasm and tenderness:  Poor posture.  Went over warm packing and stretches.  4.  Stomach upset:  With stressors regarding #3 and this, refer to Nilda SimmerNatosha Knight , LCSW. Betsy REnfrew, who accompanies her today will check with her daughter, Dierdre SearlesLi and see what LI's schedule is for a possible home visit and interpretation.  5.  Dental Decay: Also needs orange card to get set up with dental visit.   Will see if Dierdre SearlesLi can help get pt. To [facility] to get orange card.

## 2016-02-01 NOTE — Patient Instructions (Signed)
Continue your blood pressure medicine--it is helping  Do the neck exercises after sitting in warm water bath every day.

## 2016-02-02 LAB — BASIC METABOLIC PANEL
BUN / CREAT RATIO: 21 (ref 9–23)
BUN: 34 mg/dL — AB (ref 6–24)
CO2: 25 mmol/L (ref 18–29)
CREATININE: 1.6 mg/dL — AB (ref 0.57–1.00)
Calcium: 9 mg/dL (ref 8.7–10.2)
Chloride: 98 mmol/L (ref 96–106)
GFR calc non Af Amer: 35 mL/min/{1.73_m2} — ABNORMAL LOW (ref >59)
GFR, EST AFRICAN AMERICAN: 41 mL/min/{1.73_m2} — AB (ref >59)
Glucose: 79 mg/dL (ref 65–99)
Potassium: 6.1 mmol/L — ABNORMAL HIGH (ref 3.5–5.2)
Sodium: 138 mmol/L (ref 134–144)

## 2016-02-02 LAB — TSH: TSH: 0.506 u[IU]/mL (ref 0.450–4.500)

## 2016-02-03 ENCOUNTER — Other Ambulatory Visit (INDEPENDENT_AMBULATORY_CARE_PROVIDER_SITE_OTHER): Payer: Self-pay

## 2016-02-03 DIAGNOSIS — E875 Hyperkalemia: Secondary | ICD-10-CM

## 2016-02-04 ENCOUNTER — Telehealth: Payer: Self-pay | Admitting: Internal Medicine

## 2016-02-04 DIAGNOSIS — I10 Essential (primary) hypertension: Secondary | ICD-10-CM

## 2016-02-04 LAB — BASIC METABOLIC PANEL
BUN/Creatinine Ratio: 17 (ref 9–23)
BUN: 30 mg/dL — AB (ref 6–24)
CALCIUM: 8.3 mg/dL — AB (ref 8.7–10.2)
CO2: 22 mmol/L (ref 18–29)
CREATININE: 1.75 mg/dL — AB (ref 0.57–1.00)
Chloride: 95 mmol/L — ABNORMAL LOW (ref 96–106)
GFR calc Af Amer: 36 mL/min/{1.73_m2} — ABNORMAL LOW (ref >59)
GFR calc non Af Amer: 32 mL/min/{1.73_m2} — ABNORMAL LOW (ref >59)
GLUCOSE: 125 mg/dL — AB (ref 65–99)
Potassium: 4.8 mmol/L (ref 3.5–5.2)
Sodium: 131 mmol/L — ABNORMAL LOW (ref 134–144)

## 2016-02-04 MED ORDER — AMLODIPINE BESYLATE 10 MG PO TABS: 10.0000 mg | ORAL_TABLET | Freq: Every day | ORAL | Status: DC

## 2016-02-04 NOTE — Telephone Encounter (Signed)
Previous Rx did not go through as eprescription--sending again

## 2016-02-04 NOTE — Addendum Note (Signed)
Addended by: Marcene DuosMULBERRY, Alanna Storti M on: 02/04/2016 12:02 PM   Modules accepted: Orders

## 2016-02-15 ENCOUNTER — Other Ambulatory Visit: Payer: Self-pay | Admitting: Licensed Clinical Social Worker

## 2016-02-23 ENCOUNTER — Ambulatory Visit (INDEPENDENT_AMBULATORY_CARE_PROVIDER_SITE_OTHER): Payer: Self-pay | Admitting: Licensed Clinical Social Worker

## 2016-02-23 DIAGNOSIS — Z658 Other specified problems related to psychosocial circumstances: Secondary | ICD-10-CM

## 2016-02-23 DIAGNOSIS — F439 Reaction to severe stress, unspecified: Secondary | ICD-10-CM

## 2016-02-24 NOTE — Progress Notes (Signed)
   THERAPY PROGRESS NOTE  Session Time: 60min  Participation Level: Active  Behavioral Response: NeatAlertEuthymic  Type of Therapy: Individual Therapy  Treatment Goals addressed: Coping  Interventions: Supportive  Summary: Laura Harris is a 60 y.o. female who presents with a positive mood and appropriate affect. Her daughter Laura Harris attended the session in order to interpret. Laura Harris shared about her past, living [place]. She reported that she has been in the [place] for the past 6 years and that overall, she likes it. She shared that her biggest stressors are not being able to speak fluent English and not being able to drive. She expressed her sadness that she has two adult children in [place], who she misses every day. She shared about her grandchild that she cares for. She reported that she has good relationships with everyone in her family. She shared that it is sometimes lonely to be at home while other family members are at work and school. She repeatedly stated, "I'm very old." She reported that she did not think that she needed ongoing counseling but that she would contact LCSW in the future if needed.    Suicidal/Homicidal: Nowithout intent/plan  Therapist Response: LCSW began the clinical assessment but was unable to finish due to time constraints.LCSW utilized supportive counseling techniques throughout the session in order to validate emotions and encourage open expression of emotion. LCSW inquired about Laura Harris's stressors. LCSW and Laura Harris discussed her family dynamics and past. LCSW encouraged Laura Harris to reach out to LCSW in the future if she ever needs.  Plan: Return again in 0 weeks.  Diagnosis: Axis I: See current hospital problem list    Axis II: No diagnosis    Laura Simmeratosha Hermela Hardt, LCSW 02/24/2016

## 2016-03-28 ENCOUNTER — Encounter: Payer: Self-pay | Admitting: Internal Medicine

## 2016-03-28 ENCOUNTER — Ambulatory Visit (INDEPENDENT_AMBULATORY_CARE_PROVIDER_SITE_OTHER): Payer: Self-pay | Admitting: Internal Medicine

## 2016-03-28 VITALS — BP 170/92 | HR 76 | Resp 18 | Ht 62.0 in | Wt 101.0 lb

## 2016-03-28 DIAGNOSIS — I10 Essential (primary) hypertension: Secondary | ICD-10-CM

## 2016-03-28 DIAGNOSIS — K029 Dental caries, unspecified: Secondary | ICD-10-CM

## 2016-03-28 DIAGNOSIS — H547 Unspecified visual loss: Secondary | ICD-10-CM

## 2016-03-28 DIAGNOSIS — E042 Nontoxic multinodular goiter: Secondary | ICD-10-CM

## 2016-03-28 NOTE — Progress Notes (Signed)
   Subjective:    Patient ID: Laura Harris, female    DOB: 04/17/1956, 60 y.o.   MRN: [id number]  HPI   1.  Essential Hypertension: sounds like she took the Amlodipine for about 3 weeks.  She did not realize she was to refill monthly and not run out.  2.  Multiple thyroid nodules:  Last checked in 2015.  Does not appear she had the larger nodules biopsied and does not recall biopsy  3.  Dental Decay: Having dental pain.  Now has orange card so can pursue dental clinic appt.  She is able to eat.     Current outpatient prescriptions:  .  amLODipine (NORVASC) 10 MG tablet, Take 1 tablet (10 mg total) by mouth daily. (Patient not taking: Reported on 03/28/2016), Disp: 30 tablet, Rfl: 11 .  aspirin 325 MG tablet, Take 650 mg by mouth daily. Reported on 03/28/2016, Disp: , Rfl:    No Known Allergies    Review of Systems     Objective:   Physical Exam HEENT:  Significant decay with broken and blackened teeth at gum line, mainly involving molars. Neck:  Supple, no adenopathy, Un able to palpate a definitive nodule on thyroid Chest:  CTA CV:  RRR with normal S1 and S2, No S3, S4 or murmur, radial pulses normal and equal LE:  No edema      Assessment & Plan:  1.  Essential Hypertension:  Discussed at length we are not curing her hypertension, we are treating it and her bp will go back up if she does not take the medication regularly.  Discussed she has a year's worth of refills at the pharmacy.  2.  Thyroid nodules:  Await thyroid ultrasound to see if still present.  FNA if needed.  3.  Dental Decay:  Dental referral  4.  Would like eye referral as well.

## 2016-04-15 ENCOUNTER — Ambulatory Visit: Payer: Self-pay

## 2016-04-15 VITALS — BP 152/90 | HR 64

## 2016-04-15 DIAGNOSIS — I10 Essential (primary) hypertension: Secondary | ICD-10-CM

## 2016-05-26 ENCOUNTER — Ambulatory Visit: Payer: No Typology Code available for payment source | Admitting: Internal Medicine

## 2016-06-20 ENCOUNTER — Telehealth: Payer: Self-pay

## 2016-06-20 NOTE — Telephone Encounter (Signed)
Please schedule patient for a HTN follow-up. Patient was a no show for the last follow-up. Thank you.

## 2016-06-27 NOTE — Telephone Encounter (Signed)
Pat called and left message for return call to this office.  This was done on June 27, 2016.

## 2016-07-05 NOTE — Telephone Encounter (Signed)
Try again

## 2019-04-27 ENCOUNTER — Other Ambulatory Visit: Payer: Self-pay | Admitting: Family Medicine

## 2019-04-27 DIAGNOSIS — Z20822 Contact with and (suspected) exposure to covid-19: Secondary | ICD-10-CM

## 2019-04-29 LAB — NOVEL CORONAVIRUS, NAA: SARS-CoV-2, NAA: NOT DETECTED

## 2020-08-28 ENCOUNTER — Encounter: Payer: Self-pay | Admitting: Internal Medicine

## 2020-08-28 ENCOUNTER — Ambulatory Visit: Payer: Medicaid Other | Admitting: Internal Medicine

## 2020-08-28 VITALS — BP 193/104 | HR 65 | Resp 12 | Ht 61.0 in | Wt 101.0 lb

## 2020-08-28 DIAGNOSIS — I10 Essential (primary) hypertension: Secondary | ICD-10-CM

## 2020-08-28 DIAGNOSIS — E7800 Pure hypercholesterolemia, unspecified: Secondary | ICD-10-CM | POA: Insufficient documentation

## 2020-08-28 DIAGNOSIS — R918 Other nonspecific abnormal finding of lung field: Secondary | ICD-10-CM

## 2020-08-28 DIAGNOSIS — E041 Nontoxic single thyroid nodule: Secondary | ICD-10-CM

## 2020-08-28 DIAGNOSIS — N189 Chronic kidney disease, unspecified: Secondary | ICD-10-CM

## 2020-08-28 DIAGNOSIS — R739 Hyperglycemia, unspecified: Secondary | ICD-10-CM

## 2020-08-28 MED ORDER — AMLODIPINE BESYLATE 10 MG PO TABS: 10.0000 mg | ORAL_TABLET | Freq: Every day | ORAL | 3 refills | Status: DC

## 2020-08-28 NOTE — Progress Notes (Signed)
Subjective:    Patient ID: Laura Harris, female   DOB: 1956/07/12, 64 y.o.   MRN: [id number]   HPI   Daughter, Dierdre Searles, interprets.  [name]  Here to re establish.  Last seen 03/28/2016  She went back to [place] and then could not return due to COVID.  [place]. Has been without her bp med for a year.  Likely, actually since 2017 as daughter states she has not seen another doctor since last seen here. She feels dizzy at times.    When last seen here, was having difficulty understanding need to take medication indefinitely--Amlodipine--and did not follow up to see if the medication controlled her bp.  Her bp was improved, but not at goal and then lost to follow up. She also was already showing renal insufficiency.  Her energy is okay some days, others, not.      No outpatient medications have been marked as taking for the 08/28/20 encounter (Office Visit) with Julieanne Manson, MD.   No Known Allergies   Past Medical History:  Diagnosis Date   Bilateral knee pain    CKD (chronic kidney disease) 2017   Hypertension     History reviewed. No pertinent surgical history.  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  Husband was refugee and sponsored her arrival.   Lives at home with  husband, 2 youngest daughters, 3 sons.     Men in family often do not live there as they are roofers and travel around a lot   Language:   - Jarai/Montagnard    - Requires intepreter (essentially speaks no [place])      Education:   - No formal education in [place]   Other:     Contact:  Adah Salvage -- Congregational Nurse [employer], cell: [phone]                     Social Determinants of Health   Financial Resource Strain: Low Risk    Difficulty of Paying Living Expenses: Not hard at all  Food Insecurity: No Food Insecurity   Worried About Programme researcher, broadcasting/film/video in the Last Year: Never true   Ran Out of Food in the Last Year: Never true  Transportation Needs: No Transportation Needs   Lack of Transportation (Medical): No   Lack of Transportation (Non-Medical): No  Physical Activity:    Days of Exercise per Week: Not on file   Minutes of Exercise per Session: Not on file  Stress: No Stress Concern Present   Feeling of Stress : Not at all  Social Connections: Moderately Integrated  Frequency of Communication with Friends and Family: Once a week   Frequency of Social Gatherings with Friends and Family: More than three times a week   Attends Religious Services: More than 4 times per year   Active Member of [employer] or Organizations: No   Attends Engineer, structural: Never   Marital Status: Married  Catering manager Violence: Not At Risk   Fear of Current or Ex-Partner: No   Emotionally Abused: No   Physically Abused: No   Sexually Abused: No      Review of Systems    Objective:   BP (!) 193/104 (BP Location: Left Arm, Patient Position: Sitting, Cuff Size: Normal)   Pulse 65   Resp 12   Ht 5\' 1"  (1.549 m)   Wt 101 lb (45.8 kg)   BMI 19.08 kg/m   Physical Exam  Appears chronically ill, thin HEENT:  PERRL, EOMI, TMs pearly gray, throat without injection Neck:  Supple, No adenopathy, no thyroid nodules palpated  today Chest:  Wheeze in left mid and somewhat Left lower lung field posteriorly.  Few crackles throughout same area.   CV:  RRR with normal S1 and S2, No S3, S4 or murmur.  No carotid bruits.  Carotid, radial and DP /PT pulses normal and equl Abd:  S, NT, No HSM or mass, + BS LE:  No edema. Neuro:  A & O x 3, CN II-XII grossly intact.  Gait normal.   Assessment & Plan   Hypertension with CKD:  Start Amlodipine 10 mg daily.  CBC, CMP  2.  History of thyroid nodule:  no findings today.  TSH  3.  History of hyperglycemia:  A1C, FLP  4.  Left lung exam abnormal:  CXR.

## 2020-08-28 NOTE — Progress Notes (Signed)
Social worker met with new patient who is scheduled with Dr. Amil Amen for medical visit. Social worker completed New Patient Questionnaire which included completion of housing, intimate partner violence, transportation needs, stress, Emergency planning/management officer strain, food insecurity and screeners. Social History   Socioeconomic History  . Marital status: Married    Spouse name: My Rahlan  . Number of children: 7  . Years of education: Not on file  . Highest education level: Not on file  Occupational History  . 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 Smoking status: Never Smoker  . Smokeless tobacco: Never Used  Substance and Sexual Activity  . Alcohol use: No  . Drug use: No  . Sexual activity: Not on file  Other Topics Concern  . Not on file  Social History Narrative   Arrived in [place]:  2010.  Husband was refugee and sponsored her arrival.   Lives at home with husband, 2 youngest daughters, 3 sons and one son's wife.  Men in family often do not live there as they are roofers and travel around a lot.      Employment:  Unemployed      Language:   - Jarai/Montagnard    - Requires intepreter (essentially speaks no [place])      Education:   - No formal education in [place]      Preventative Care:   - Has never had any preventative care       Other:     Contact:  Berneta Sages -- Congregational Nurse [employer], cell: [phone]                     Social Determinants of Health   Financial Resource Strain: Low Risk   . Difficulty of Paying Living Expenses: Not hard at all  Food Insecurity: No Food Insecurity  . Worried About Charity fundraiser in the Last Year: Never true  . Ran Out of Food in the Last Year: Never true  Transportation Needs: No Transportation Needs  . Lack of Transportation (Medical): No  . Lack of Transportation (Non-Medical): No  Physical Activity:   . Days of Exercise per Week: Not  on file  . Minutes of Exercise per Session: Not on file  Stress: No Stress Concern Present  . Feeling of Stress : Not at all  Social Connections: Moderately Integrated  . Frequency of Communication with Friends and Family: Once a week  . Frequency of Social Gatherings with Friends and Family: More than three times a week  . Attends Religious Services: More than 4 times per year  . Active Member of Clubs or Organizations: No  . Attends Archivist Meetings: Never  . Marital Status: Married    Depression screen [facility] 2/9 08/28/2020  Decreased Interest 0  Down, Depressed, Hopeless 0  PHQ - 2 Score 0  Altered sleeping 0  Tired, decreased energy 2  Change in appetite 1  Feeling bad or failure about yourself  0  Trouble concentrating 0  Moving slowly or fidgety/restless 0  Suicidal thoughts 0  PHQ-9 Score 3  Difficult doing work/chores Not difficult at all    GAD 7 : Generalized Anxiety Score 08/28/2020  Nervous, Anxious, on Edge 0  Control/stop worrying 0  Worry too much - different things 0  Trouble relaxing 0  Restless 0  Easily annoyed or irritable 0  Afraid - awful might happen 0  Total GAD 7 Score 0  Anxiety Difficulty Not difficult at all     Based on presentation no recommendations at this time. LCSW answered daughters questions related to orange card as husband of patients provides income for the home. Only reported some feeling tired and appetite changes other than that daughter reports she stays at home and keeps busy in the home.

## 2020-08-28 NOTE — Patient Instructions (Signed)
[facility] [address], [address] [place], [place]  [address]  [phone]  

## 2020-08-29 LAB — LIPID PANEL W/O CHOL/HDL RATIO
Cholesterol, Total: 255 mg/dL — ABNORMAL HIGH (ref 100–199)
HDL: 62 mg/dL (ref >39)
LDL Chol Calc (NIH): 159 mg/dL — ABNORMAL HIGH (ref 0–99)
Triglycerides: 186 mg/dL — ABNORMAL HIGH (ref 0–149)
VLDL Cholesterol Cal: 34 mg/dL (ref 5–40)

## 2020-08-29 LAB — COMPREHENSIVE METABOLIC PANEL
ALT: 14 IU/L (ref 0–32)
AST: 25 IU/L (ref 0–40)
Albumin/Globulin Ratio: 1.4 (ref 1.2–2.2)
Albumin: 4.4 g/dL (ref 3.8–4.8)
Alkaline Phosphatase: 79 IU/L (ref 44–121)
BUN/Creatinine Ratio: 12 (ref 12–28)
BUN: 11 mg/dL (ref 8–27)
Bilirubin Total: 0.7 mg/dL (ref 0.0–1.2)
CO2: 26 mmol/L (ref 20–29)
Calcium: 9 mg/dL (ref 8.7–10.3)
Chloride: 99 mmol/L (ref 96–106)
Creatinine, Ser: 0.9 mg/dL (ref 0.57–1.00)
GFR calc Af Amer: 79 mL/min/{1.73_m2} (ref >59)
GFR calc non Af Amer: 69 mL/min/{1.73_m2} (ref >59)
Globulin, Total: 3.2 g/dL (ref 1.5–4.5)
Glucose: 76 mg/dL (ref 65–99)
Potassium: 4.1 mmol/L (ref 3.5–5.2)
Sodium: 138 mmol/L (ref 134–144)
Total Protein: 7.6 g/dL (ref 6.0–8.5)

## 2020-08-29 LAB — CBC WITH DIFFERENTIAL/PLATELET
Basophils Absolute: 0 10*3/uL (ref 0.0–0.2)
Basos: 1 %
EOS (ABSOLUTE): 0.3 10*3/uL (ref 0.0–0.4)
Eos: 7 %
Hematocrit: 39.4 % (ref 34.0–46.6)
Hemoglobin: 12.7 g/dL (ref 11.1–15.9)
Immature Grans (Abs): 0 10*3/uL (ref 0.0–0.1)
Immature Granulocytes: 0 %
Lymphocytes Absolute: 0.9 10*3/uL (ref 0.7–3.1)
Lymphs: 22 %
MCH: 25.1 pg — ABNORMAL LOW (ref 26.6–33.0)
MCHC: 32.2 g/dL (ref 31.5–35.7)
MCV: 78 fL — ABNORMAL LOW (ref 79–97)
Monocytes Absolute: 0.2 10*3/uL (ref 0.1–0.9)
Monocytes: 4 %
Neutrophils Absolute: 2.8 10*3/uL (ref 1.4–7.0)
Neutrophils: 66 %
Platelets: 130 10*3/uL — ABNORMAL LOW (ref 150–450)
RBC: 5.05 x10E6/uL (ref 3.77–5.28)
RDW: 13.6 % (ref 11.7–15.4)
WBC: 4.3 10*3/uL (ref 3.4–10.8)

## 2020-08-29 LAB — HGB A1C W/O EAG: Hgb A1c MFr Bld: 5.3 % (ref 4.8–5.6)

## 2020-08-29 LAB — TSH: TSH: 0.926 u[IU]/mL (ref 0.450–4.500)

## 2020-09-01 ENCOUNTER — Ambulatory Visit
Admission: RE | Admit: 2020-09-01 | Discharge: 2020-09-01 | Disposition: A | Discharge status: Home / Self Care | Payer: Self-pay | Source: Ambulatory Visit | Attending: Internal Medicine | Admitting: Internal Medicine

## 2020-09-01 DIAGNOSIS — R918 Other nonspecific abnormal finding of lung field: Secondary | ICD-10-CM

## 2020-09-11 ENCOUNTER — Ambulatory Visit: Payer: Self-pay | Admitting: Internal Medicine

## 2020-09-11 VITALS — BP 180/98 | HR 72

## 2020-09-11 DIAGNOSIS — I10 Essential (primary) hypertension: Secondary | ICD-10-CM

## 2020-09-11 MED ORDER — LISINOPRIL 10 MG PO TABS: 10.0000 mg | ORAL_TABLET | Freq: Every day | ORAL | 11 refills | Status: DC

## 2020-09-25 ENCOUNTER — Telehealth: Payer: Self-pay | Admitting: Internal Medicine

## 2020-09-25 ENCOUNTER — Other Ambulatory Visit: Payer: Self-pay

## 2020-09-25 VITALS — BP 160/90 | HR 70

## 2020-09-25 DIAGNOSIS — I10 Essential (primary) hypertension: Secondary | ICD-10-CM

## 2020-09-25 NOTE — Progress Notes (Signed)
Not taking Amlodipine. Will have Nicky call to let patient know she needs to take Amlodipine as well and follow up in 2 weeks for repeat BP check.

## 2020-09-25 NOTE — Telephone Encounter (Signed)
Called patient's numbers listed oh her chart- unable to get on hold. Calling to inform patient needs to be taking lisinopril and amlodipine both of them for BP.  Daughter Laura Harris returned and information was given. Verbalized understanding. BP and pulse check scheduled as well for11/19/21 @ 11:00 am

## 2020-09-26 LAB — BASIC METABOLIC PANEL
BUN/Creatinine Ratio: 22 (ref 12–28)
BUN: 24 mg/dL (ref 8–27)
CO2: 25 mmol/L (ref 20–29)
Calcium: 9.4 mg/dL (ref 8.7–10.3)
Chloride: 101 mmol/L (ref 96–106)
Creatinine, Ser: 1.09 mg/dL — ABNORMAL HIGH (ref 0.57–1.00)
GFR calc Af Amer: 63 mL/min/{1.73_m2} (ref >59)
GFR calc non Af Amer: 55 mL/min/{1.73_m2} — ABNORMAL LOW (ref >59)
Glucose: 78 mg/dL (ref 65–99)
Potassium: 4.5 mmol/L (ref 3.5–5.2)
Sodium: 139 mmol/L (ref 134–144)

## 2020-10-09 ENCOUNTER — Ambulatory Visit: Payer: Self-pay | Admitting: Internal Medicine

## 2020-10-09 ENCOUNTER — Other Ambulatory Visit: Payer: Self-pay

## 2020-10-09 VITALS — BP 132/80 | HR 78

## 2020-10-09 DIAGNOSIS — I10 Essential (primary) hypertension: Secondary | ICD-10-CM

## 2020-10-09 NOTE — Progress Notes (Signed)
B/P improved.

## 2020-10-18 ENCOUNTER — Encounter: Payer: Self-pay | Admitting: Internal Medicine

## 2020-10-18 DIAGNOSIS — E785 Hyperlipidemia, unspecified: Secondary | ICD-10-CM

## 2020-10-18 HISTORY — DX: Hyperlipidemia, unspecified: E78.5

## 2020-10-26 ENCOUNTER — Encounter: Payer: Self-pay | Admitting: Internal Medicine

## 2020-12-08 ENCOUNTER — Other Ambulatory Visit: Payer: Self-pay

## 2020-12-08 ENCOUNTER — Encounter: Payer: Self-pay | Admitting: Internal Medicine

## 2020-12-08 ENCOUNTER — Ambulatory Visit: Payer: Medicaid Other | Admitting: Internal Medicine

## 2020-12-08 VITALS — BP 149/82 | HR 74 | Resp 12 | Ht 61.0 in | Wt 104.0 lb

## 2020-12-08 DIAGNOSIS — I10 Essential (primary) hypertension: Secondary | ICD-10-CM

## 2020-12-08 DIAGNOSIS — J9811 Atelectasis: Secondary | ICD-10-CM

## 2020-12-08 DIAGNOSIS — N189 Chronic kidney disease, unspecified: Secondary | ICD-10-CM

## 2020-12-08 DIAGNOSIS — Z1231 Encounter for screening mammogram for malignant neoplasm of breast: Secondary | ICD-10-CM

## 2020-12-08 DIAGNOSIS — E782 Mixed hyperlipidemia: Secondary | ICD-10-CM

## 2020-12-08 MED ORDER — LISINOPRIL 10 MG PO TABS: 10.0000 mg | ORAL_TABLET | Freq: Every day | ORAL | 3 refills | Status: DC

## 2020-12-08 NOTE — Progress Notes (Signed)
° ° °  Subjective:    Patient ID: Laura Harris, female   DOB: 1956/06/19, 65 y.o.   MRN: [id number]   HPI   Daughter, Dierdre Searles, interprets via phone.  1.  Hypertension:  Has not taken her bp meds yet this morning.  Generally takes both Lisinopril and Amlodipine after 10 a.m daily.  Lisinopril was added at last bp check 09/11/20.  BP was much improved 11/19 at 132/80.   Patient admits to decreasing to one medication daily as her bp was fine when monitoring at home. Stopped maybe a week or more ago.   Restarted her second medication again 2 days ago as her bp was increased again.  2.  No CPE in some time.  Has not had COVID vaccination yet.    3.  History of atelectasis on CXR, left lower lung, in October 2021.  No symptoms of cough or dyspnea.  .  Current Meds  Medication Sig   amLODipine (NORVASC) 10 MG tablet Take 1 tablet (10 mg total) by mouth daily.   lisinopril (ZESTRIL) 10 MG tablet Take 1 tablet (10 mg total) by mouth daily.   No Known Allergies   Review of Systems    Objective:   BP (!) 149/82 (BP Location: Right Arm, Patient Position: Sitting, Cuff Size: Normal)    Pulse 74    Resp 12    Ht 5\' 1"  (1.549 m)    Wt 104 lb (47.2 kg)    BMI 19.65 kg/m   Physical Exam  NAD Lungs:  Mild dry crackles at left base posteriorly, which improve after cough and deep breathing. CV:  RRR without murmur or rub.  Radial and DP pulses normal and equal. LE:  No edema.   Assessment & Plan   1.  Hypertension with CKD:  Discussed difference between treatment and cure and that she will need to take her meds regularly to keep BP in a good place.   Return in 1 month for nurse visit and bp/BMP check.    2.  HM:  Not clear she will get vaccinated for COVID.  She is fine with calling her to discuss getting the vaccination in her language, [place].   Schedule mammogram Schedule CPE in 3-4 months.   3.  Hyperlipidemia:  Has decreased fried or oily foods since her high cholesterol in  October.  Eats lots of vegetables.  Physically active in her backyard regularly when not too cold.    4.  Atelectasis, left lung base:  Previously with CXR done for findings of crackles that clear.

## 2020-12-31 ENCOUNTER — Emergency Department (HOSPITAL_COMMUNITY)
Admission: EM | Admit: 2020-12-31 | Discharge: 2020-12-31 | Disposition: A | Discharge status: Home / Self Care | Payer: Medicaid Other | Attending: Emergency Medicine | Admitting: Emergency Medicine

## 2020-12-31 ENCOUNTER — Emergency Department (HOSPITAL_COMMUNITY): Payer: Medicaid Other

## 2020-12-31 ENCOUNTER — Encounter (HOSPITAL_COMMUNITY): Payer: Self-pay | Admitting: Emergency Medicine

## 2020-12-31 ENCOUNTER — Other Ambulatory Visit: Payer: Self-pay

## 2020-12-31 DIAGNOSIS — Z79899 Other long term (current) drug therapy: Secondary | ICD-10-CM | POA: Insufficient documentation

## 2020-12-31 DIAGNOSIS — Z7982 Long term (current) use of aspirin: Secondary | ICD-10-CM | POA: Insufficient documentation

## 2020-12-31 DIAGNOSIS — H5500 Unspecified nystagmus: Secondary | ICD-10-CM | POA: Insufficient documentation

## 2020-12-31 DIAGNOSIS — N189 Chronic kidney disease, unspecified: Secondary | ICD-10-CM | POA: Insufficient documentation

## 2020-12-31 DIAGNOSIS — R519 Headache, unspecified: Secondary | ICD-10-CM | POA: Insufficient documentation

## 2020-12-31 DIAGNOSIS — I129 Hypertensive chronic kidney disease with stage 1 through stage 4 chronic kidney disease, or unspecified chronic kidney disease: Secondary | ICD-10-CM | POA: Insufficient documentation

## 2020-12-31 DIAGNOSIS — M542 Cervicalgia: Secondary | ICD-10-CM | POA: Insufficient documentation

## 2020-12-31 DIAGNOSIS — R42 Dizziness and giddiness: Secondary | ICD-10-CM | POA: Insufficient documentation

## 2020-12-31 LAB — CBC
HCT: 34 % — ABNORMAL LOW (ref 36.0–46.0)
Hemoglobin: 11.2 g/dL — ABNORMAL LOW (ref 12.0–15.0)
MCH: 26.4 pg (ref 26.0–34.0)
MCHC: 32.9 g/dL (ref 30.0–36.0)
MCV: 80 fL (ref 80.0–100.0)
Platelets: 156 10*3/uL (ref 150–400)
RBC: 4.25 MIL/uL (ref 3.87–5.11)
RDW: 12.5 % (ref 11.5–15.5)
WBC: 7.4 10*3/uL (ref 4.0–10.5)
nRBC: 0 % (ref 0.0–0.2)

## 2020-12-31 LAB — URINALYSIS, ROUTINE W REFLEX MICROSCOPIC
Bilirubin Urine: NEGATIVE
Glucose, UA: NEGATIVE mg/dL
Hgb urine dipstick: NEGATIVE
Ketones, ur: NEGATIVE mg/dL
Nitrite: NEGATIVE
Protein, ur: 30 mg/dL — AB
Specific Gravity, Urine: 1.015 (ref 1.005–1.030)
pH: 7 (ref 5.0–8.0)

## 2020-12-31 LAB — COMPREHENSIVE METABOLIC PANEL
ALT: 13 U/L (ref 0–44)
AST: 18 U/L (ref 15–41)
Albumin: 4.2 g/dL (ref 3.5–5.0)
Alkaline Phosphatase: 53 U/L (ref 38–126)
Anion gap: 12 (ref 5–15)
BUN: 14 mg/dL (ref 8–23)
CO2: 24 mmol/L (ref 22–32)
Calcium: 9.1 mg/dL (ref 8.9–10.3)
Chloride: 100 mmol/L (ref 98–111)
Creatinine, Ser: 1.11 mg/dL — ABNORMAL HIGH (ref 0.44–1.00)
GFR, Estimated: 56 mL/min — ABNORMAL LOW (ref >60)
Glucose, Bld: 160 mg/dL — ABNORMAL HIGH (ref 70–99)
Potassium: 3.7 mmol/L (ref 3.5–5.1)
Sodium: 136 mmol/L (ref 135–145)
Total Bilirubin: 1.1 mg/dL (ref 0.3–1.2)
Total Protein: 7.5 g/dL (ref 6.5–8.1)

## 2020-12-31 LAB — URINALYSIS, MICROSCOPIC (REFLEX): Bacteria, UA: NONE SEEN

## 2020-12-31 LAB — LIPASE, BLOOD: Lipase: 48 U/L (ref 11–51)

## 2020-12-31 MED ORDER — MECLIZINE HCL 25 MG PO TABS: 25.0000 mg | ORAL_TABLET | Freq: Three times a day (TID) | ORAL | 0 refills | Status: DC | PRN

## 2020-12-31 MED ORDER — ONDANSETRON 4 MG PO TBDP
4.0000 mg | ORAL_TABLET | Freq: Once | ORAL | Status: AC | PRN
  Administered 2020-12-31: 4 mg via ORAL
  Filled 2020-12-31: qty 1

## 2020-12-31 MED ORDER — ONDANSETRON 4 MG PO TBDP: 4.0000 mg | ORAL_TABLET | Freq: Three times a day (TID) | ORAL | 0 refills | Status: DC | PRN

## 2020-12-31 NOTE — ED Provider Notes (Signed)
[facility] EMERGENCY DEPARTMENT Provider Note   CSN: [id number] Arrival date & time: 12/31/20  0231     History Chief Complaint  Patient presents with  . Emesis/Dizzy    Laura Harris is a 65 y.o. female. Patient is Laura Harris yard and was translated by family member. HPI Patient presented with dizziness emesis and headache.  States that she woke up last night with dizziness.  Felt as if things were spinning around.  Unable to get up.  Developed nausea and vomiting.  Feeling somewhat better now but not back to baseline.  Has a frontal headache but also some neck pain.  No trauma.  Has had episodes of some dizziness like this before but usually not this severe.  Does have some ringing in her left ear.  States it felt as if the room was spinning around.  Did have some blurred vision with it also.    Past Medical History:  Diagnosis Date  . Bilateral knee pain   . CKD (chronic kidney disease) 2017  . Hyperlipidemia 10/18/2020  . Hypertension     Patient Active Problem List   Diagnosis Date Noted  . Hyperlipidemia 08/28/2020  . CKD (chronic kidney disease) 2017  . Thyroid nodule 03/12/2014  . Hypertension 10/28/2013  . Stomach pain 10/28/2013  . Headache(784.0) 10/28/2013    History reviewed. No pertinent surgical history.   OB History   No obstetric history on file.     Family History  Problem Relation Age of Onset  . GER disease Mother   . Migraines Father   . Hypertension Daughter   . Anemia Daughter   . Migraines Brother   . Hypertension Brother     Social History   Tobacco Use  . Smoking status: Never Smoker  . Smokeless tobacco: Never Used  Substance Use Topics  . Alcohol use: No  . Drug use: No    Home Medications Prior to Admission medications   Medication Sig Start Date End Date Taking? Authorizing Provider  meclizine (ANTIVERT) 25 MG tablet Take 1 tablet (25 mg total) by mouth 3 (three) times daily as needed for dizziness. 12/31/20   Yes Benjiman Core, MD  ondansetron (ZOFRAN-ODT) 4 MG disintegrating tablet Take 1 tablet (4 mg total) by mouth every 8 (eight) hours as needed for nausea or vomiting. 12/31/20  Yes Benjiman Core, MD  amLODipine (NORVASC) 10 MG tablet Take 1 tablet (10 mg total) by mouth daily. 08/28/20   Julieanne Manson, MD  aspirin 325 MG tablet Take 650 mg by mouth daily. Reported on 03/28/2016 Patient not taking: No sig reported    [provider]  lisinopril (ZESTRIL) 10 MG tablet Take 1 tablet (10 mg total) by mouth daily. 12/08/20   Julieanne Manson, MD    Allergies    Patient has no known allergies.  Review of Systems   Review of Systems  Constitutional: Positive for appetite change.  HENT: Negative for congestion.   Respiratory: Negative for shortness of breath.   Cardiovascular: Negative for chest pain.  Gastrointestinal: Negative for abdominal pain.  Genitourinary: Negative for flank pain.  Musculoskeletal: Positive for neck pain. Negative for back pain.  Skin: Negative for rash.  Neurological: Positive for dizziness and headaches. Negative for light-headedness.  Psychiatric/Behavioral: Negative for confusion.    Physical Exam Updated Vital Signs BP (!) 172/78 (BP Location: Right Arm)   Pulse 83   Temp 98 F (36.7 C) (Oral)   Resp 14   SpO2 100%   Physical  Exam Vitals and nursing note reviewed.  HENT:     Head: Normocephalic.     Right Ear: Tympanic membrane normal.     Left Ear: Tympanic membrane normal.     Mouth/Throat:     Mouth: Mucous membranes are moist.  Eyes:     Pupils: Pupils are equal, round, and reactive to light.     Comments: Eye movements intact does have conjugate gaze, but does have some mild nystagmus, worse with looking to the right.  Neck:     Comments: Mild muscular tenderness on left posterior side of neck. Cardiovascular:     Rate and Rhythm: Regular rhythm.  Pulmonary:     Breath sounds: No wheezing or rhonchi.  Abdominal:      Tenderness: There is no abdominal tenderness.  Musculoskeletal:        General: No tenderness.     Cervical back: Neck supple. No rigidity.  Skin:    General: Skin is warm.     Capillary Refill: Capillary refill takes less than 2 seconds.  Neurological:     Mental Status: She is alert and oriented to person, place, and time.  Psychiatric:        Mood and Affect: Mood normal.     ED Results / Procedures / Treatments   Labs (all labs ordered are listed, but only abnormal results are displayed) Labs Reviewed  COMPREHENSIVE METABOLIC PANEL - Abnormal; Notable for the following components:      Result Value   Glucose, Bld 160 (*)    Creatinine, Ser 1.11 (*)    GFR, Estimated 56 (*)    All other components within normal limits  CBC - Abnormal; Notable for the following components:   Hemoglobin 11.2 (*)    HCT 34.0 (*)    All other components within normal limits  URINALYSIS, ROUTINE W REFLEX MICROSCOPIC - Abnormal; Notable for the following components:   Protein, ur 30 (*)    Leukocytes,Ua SMALL (*)    All other components within normal limits  URINALYSIS, MICROSCOPIC (REFLEX) - Abnormal; Notable for the following components:   Non Squamous Epithelial PRESENT (*)    All other components within normal limits  LIPASE, BLOOD    EKG None  Radiology MR BRAIN WO CONTRAST  Result Date: 12/31/2020 CLINICAL DATA:  Vertigo.  Dizziness and emesis. EXAM: MRI HEAD WITHOUT CONTRAST TECHNIQUE: Multiplanar, multiecho pulse sequences of the brain and surrounding structures were obtained without intravenous contrast. COMPARISON:  None. FINDINGS: Brain: No acute infarction, hemorrhage, hydrocephalus, extra-axial collection or mass lesion. Mild scattered T2/FLAIR hyperintensities within the white matter, nonspecific but most likely related to chronic microvascular ischemic disease. Partially empty sella. Vascular: Major arterial flow voids are maintained at the skull base. Small vertebrobasilar  system with suspected fetal type PCAs. Skull and upper cervical spine: Normal marrow signal. Sinuses/Orbits: Mild scattered paranasal sinus mucosal thickening without air-fluid levels. Other: No mastoid effusions. IMPRESSION: 1. No evidence of acute intracranial abnormality.  No acute infarct. 2. Mild chronic microvascular ischemic disease. 3. Partially empty sella. This may be an incidental finding, but can be seen with idiopathic intracranial hypertension in the correct clinical setting. Electronically Signed   By: Feliberto Harts MD   On: 12/31/2020 09:31    Procedures Procedures   Medications Ordered in ED Medications  ondansetron (ZOFRAN-ODT) disintegrating tablet 4 mg (4 mg Oral Given 12/31/20 0249)    ED Course  I have reviewed the triage vital signs and the nursing notes.  Pertinent labs &  imaging results that were available during my care of the patient were reviewed by me and considered in my medical decision making (see chart for details).    MDM Rules/Calculators/A&P                          Patient with vertigo.  Has had some history of same.  However there is a language barrier.  Translator phone has been used.  Ringing in ears. Mild nystagmus. MRI done due to nystagmus and language barrier. No stroke. Partially empty sella can be followed as an outpatient.  ENT follow-up for vertigo. Final Clinical Impression(s) / ED Diagnoses Final diagnoses:  Vertigo    Rx / DC Orders ED Discharge Orders         Ordered    meclizine (ANTIVERT) 25 MG tablet  3 times daily PRN        12/31/20 1050    ondansetron (ZOFRAN-ODT) 4 MG disintegrating tablet  Every 8 hours PRN        12/31/20 1050           Benjiman Core, MD 12/31/20 1515

## 2020-12-31 NOTE — ED Notes (Signed)
Ambulated patient in the hallway. Pt is able to ambulate with no assistance. No complaints of dizziness.

## 2020-12-31 NOTE — ED Triage Notes (Signed)
Patient reports dizziness and emesis this evening , denies diarrhea or fever .

## 2020-12-31 NOTE — Discharge Instructions (Addendum)
Your vertigo is likely a peripheral vertigo.  Follow-up with ear nose and throat and take the medicine as needed.  Also the MRI showed a partially empty sella.  You can follow-up with your primary care doctor or neurology for this.

## 2020-12-31 NOTE — ED Notes (Signed)
Patient transported to MRI 

## 2021-01-04 ENCOUNTER — Other Ambulatory Visit: Payer: Self-pay

## 2021-01-04 ENCOUNTER — Other Ambulatory Visit (INDEPENDENT_AMBULATORY_CARE_PROVIDER_SITE_OTHER): Payer: Medicaid Other | Admitting: Internal Medicine

## 2021-01-04 DIAGNOSIS — R509 Fever, unspecified: Secondary | ICD-10-CM | POA: Diagnosis not present

## 2021-01-04 DIAGNOSIS — R059 Cough, unspecified: Secondary | ICD-10-CM

## 2021-01-04 LAB — POC COVID19 BINAXNOW: SARS Coronavirus 2 Ag: NEGATIVE

## 2021-01-04 NOTE — Progress Notes (Signed)
Was to have bp check and BMP today, but when entered, complained of fever 5 days ago and then again last night.   Was seen on 12/31/2020 at Urgent care with vertigo and tinnitus, nausea and vomiting.  Has had this before, but daughters interpret that this does not happen often. MR of brain in ED showed partially empty sella. TSH performed in October was normal.   She has had headache a bit still and cough. Everyone in family has had similar symptoms.  COVID 19 negative.

## 2021-01-17 NOTE — Progress Notes (Signed)
BP remains high on Amlodipine. Added Lisinopril 10 mg daily. Follow up in 2 weeks for BP check and BMP

## 2021-04-12 ENCOUNTER — Other Ambulatory Visit (INDEPENDENT_AMBULATORY_CARE_PROVIDER_SITE_OTHER): Payer: Medicaid Other | Admitting: Internal Medicine

## 2021-04-12 ENCOUNTER — Other Ambulatory Visit: Payer: Self-pay

## 2021-04-12 DIAGNOSIS — R739 Hyperglycemia, unspecified: Secondary | ICD-10-CM

## 2021-04-12 DIAGNOSIS — E782 Mixed hyperlipidemia: Secondary | ICD-10-CM

## 2021-04-12 DIAGNOSIS — Z79899 Other long term (current) drug therapy: Secondary | ICD-10-CM

## 2021-04-12 DIAGNOSIS — E041 Nontoxic single thyroid nodule: Secondary | ICD-10-CM

## 2021-04-13 LAB — COMPREHENSIVE METABOLIC PANEL
ALT: 12 IU/L (ref 0–32)
AST: 21 IU/L (ref 0–40)
Albumin/Globulin Ratio: 1.6 (ref 1.2–2.2)
Albumin: 5.2 g/dL — ABNORMAL HIGH (ref 3.8–4.8)
Alkaline Phosphatase: 70 IU/L (ref 44–121)
BUN/Creatinine Ratio: 22 (ref 12–28)
BUN: 24 mg/dL (ref 8–27)
Bilirubin Total: 0.6 mg/dL (ref 0.0–1.2)
CO2: 22 mmol/L (ref 20–29)
Calcium: 9.2 mg/dL (ref 8.7–10.3)
Chloride: 102 mmol/L (ref 96–106)
Creatinine, Ser: 1.08 mg/dL — ABNORMAL HIGH (ref 0.57–1.00)
Globulin, Total: 3.3 g/dL (ref 1.5–4.5)
Glucose: 84 mg/dL (ref 65–99)
Potassium: 4.8 mmol/L (ref 3.5–5.2)
Sodium: 141 mmol/L (ref 134–144)
Total Protein: 8.5 g/dL (ref 6.0–8.5)
eGFR: 58 mL/min/{1.73_m2} — ABNORMAL LOW (ref >59)

## 2021-04-13 LAB — HGB A1C W/O EAG: Hgb A1c MFr Bld: 5.6 % (ref 4.8–5.6)

## 2021-04-13 LAB — LIPID PANEL W/O CHOL/HDL RATIO
Cholesterol, Total: 259 mg/dL — ABNORMAL HIGH (ref 100–199)
HDL: 80 mg/dL (ref >39)
LDL Chol Calc (NIH): 159 mg/dL — ABNORMAL HIGH (ref 0–99)
Triglycerides: 114 mg/dL (ref 0–149)
VLDL Cholesterol Cal: 20 mg/dL (ref 5–40)

## 2021-04-13 LAB — TSH: TSH: 0.865 u[IU]/mL (ref 0.450–4.500)

## 2021-04-29 ENCOUNTER — Encounter: Payer: Medicaid Other | Admitting: Internal Medicine

## 2021-06-01 ENCOUNTER — Telehealth: Payer: Self-pay

## 2021-06-01 ENCOUNTER — Ambulatory Visit (INDEPENDENT_AMBULATORY_CARE_PROVIDER_SITE_OTHER): Payer: Medicaid Other | Admitting: Internal Medicine

## 2021-06-01 VITALS — BP 142/82

## 2021-06-01 DIAGNOSIS — I10 Essential (primary) hypertension: Secondary | ICD-10-CM | POA: Diagnosis not present

## 2021-06-01 NOTE — Telephone Encounter (Signed)
Pt would like a copy of medical records

## 2021-06-02 MED ORDER — LISINOPRIL 20 MG PO TABS: ORAL_TABLET | ORAL | 3 refills | Status: DC

## 2021-06-02 NOTE — Patient Instructions (Addendum)
Patient maintaining too high BP. Increasing Lisinopril to 20 mg daily and continuing with amlodipine 10 mg daily Schedule BMP with bp check in 1 month Patient called and notified to increase lisinopril to 20mg  daily and continue with amlodipine 10 mg daily. Patient scheduled for BMP with bp check in a month

## 2021-06-17 NOTE — Telephone Encounter (Signed)
Records are ready for pick up. Pt. Informed.

## 2021-07-05 DIAGNOSIS — R739 Hyperglycemia, unspecified: Secondary | ICD-10-CM | POA: Insufficient documentation

## 2021-07-06 ENCOUNTER — Other Ambulatory Visit: Payer: Self-pay

## 2021-07-06 ENCOUNTER — Other Ambulatory Visit (INDEPENDENT_AMBULATORY_CARE_PROVIDER_SITE_OTHER): Payer: Medicaid Other

## 2021-07-06 VITALS — BP 140/78 | HR 71

## 2021-07-06 DIAGNOSIS — N189 Chronic kidney disease, unspecified: Secondary | ICD-10-CM

## 2021-07-07 LAB — BASIC METABOLIC PANEL
BUN/Creatinine Ratio: 22 (ref 12–28)
BUN: 26 mg/dL (ref 8–27)
CO2: 22 mmol/L (ref 20–29)
Calcium: 9.4 mg/dL (ref 8.7–10.3)
Chloride: 102 mmol/L (ref 96–106)
Creatinine, Ser: 1.17 mg/dL — ABNORMAL HIGH (ref 0.57–1.00)
Glucose: 82 mg/dL (ref 65–99)
Potassium: 4.8 mmol/L (ref 3.5–5.2)
Sodium: 138 mmol/L (ref 134–144)
eGFR: 52 mL/min/{1.73_m2} — ABNORMAL LOW (ref >59)

## 2021-07-25 IMAGING — MR MR HEAD W/O CM
8 of 10 series · 36 of 48 positions shown · non-contrast
Comparison: None.

CLINICAL DATA: Vertigo.  Dizziness and emesis.

EXAM:
MRI HEAD WITHOUT CONTRAST
TECHNIQUE: Multiplanar, multiecho pulse sequences of the brain and surrounding
structures were obtained without intravenous contrast.

[Series 3: DWI · axial · 3.0mm · 1.09mm/px · z∈[-79,+52]mm · 9 of 92 slices shown (1 of 4)]
[im 1/92]
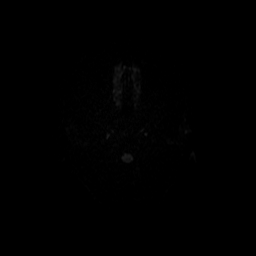
[im 12/92]
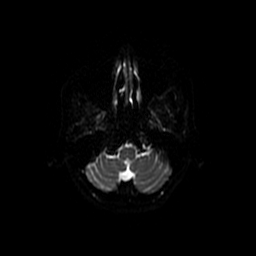
[im 23/92]
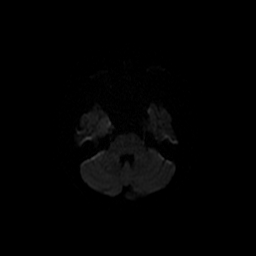
[im 35/92]
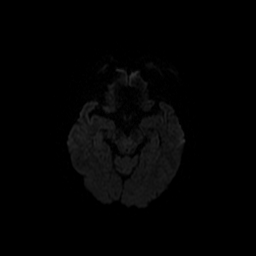
[im 46/92]
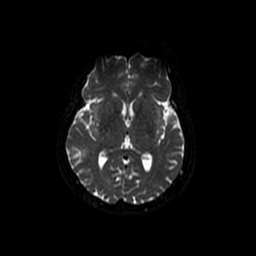
[im 57/92]
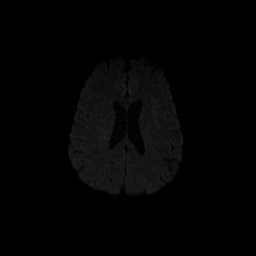
[im 69/92]
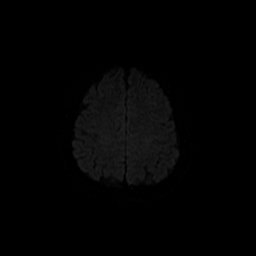
[im 80/92]
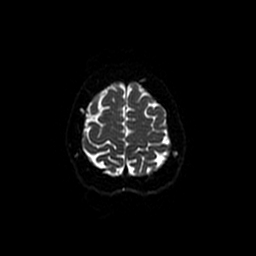
[im 92/92]
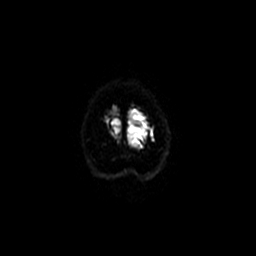

[Series 4: DWI · coronal · 5.0mm · 1.09mm/px · 6 of 68 slices shown (2 of 4)]
[im 1/68]
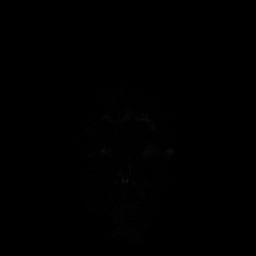
[im 14/68]
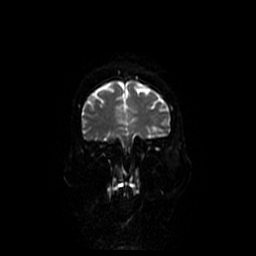
[im 27/68]
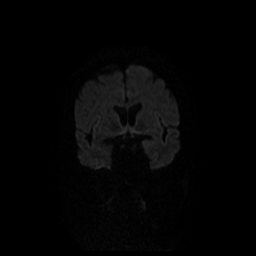
[im 41/68]
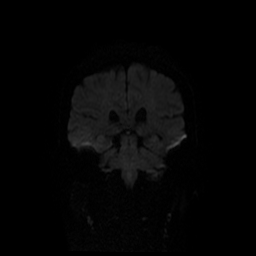
[im 54/68]
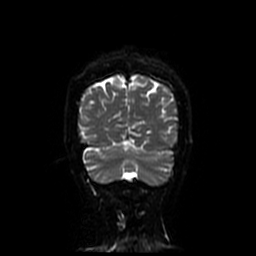
[im 68/68]
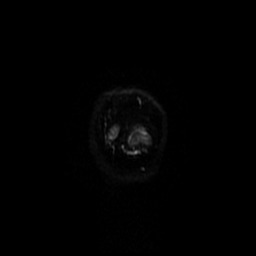

[Series 5: T1 · sagittal · 5.0mm · 0.47mm/px · 3 of 24 slices shown]
[im 1/24]
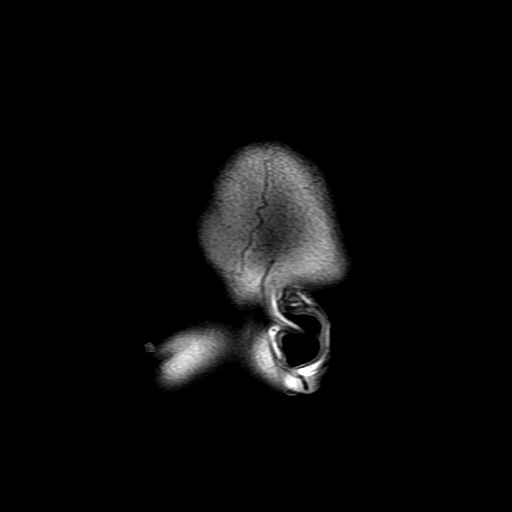
[im 12/24]
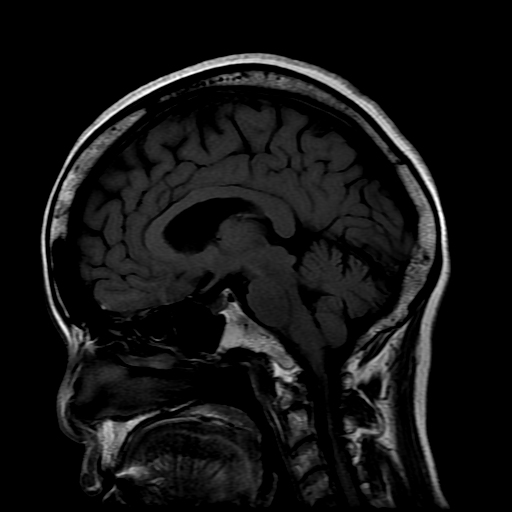
[im 24/24]
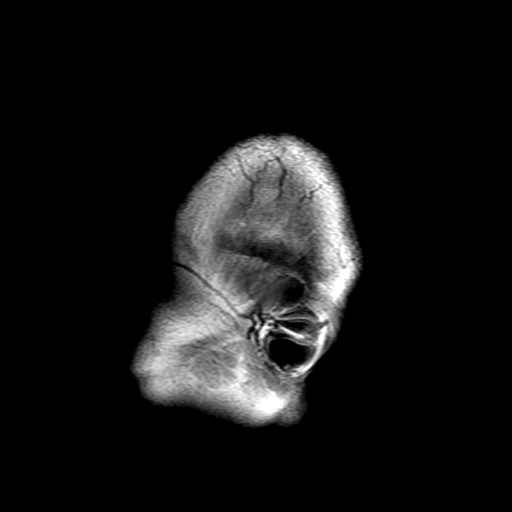

[Series 6: T2 · axial · 5.0mm · 0.43mm/px · z∈[-76,+57]mm · 3 of 24 slices shown (1 of 2)]
[im 1/24]
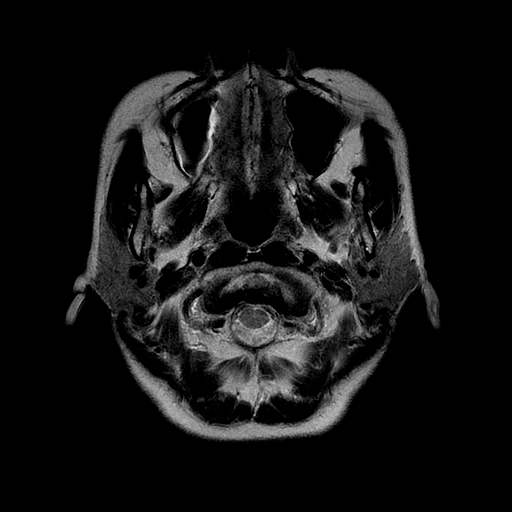
[im 12/24]
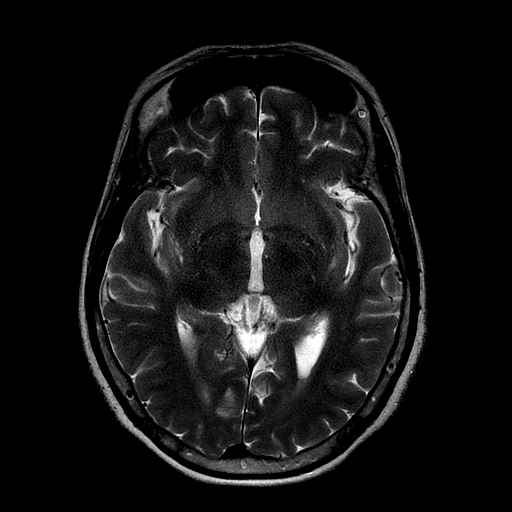
[im 24/24]
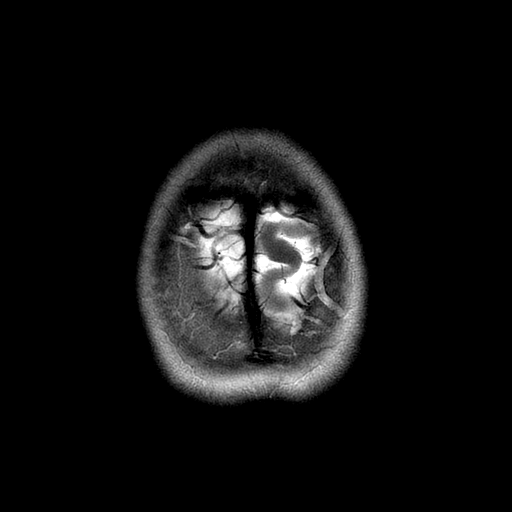

[Series 7: FLAIR · axial · 3.0mm · 0.43mm/px · z∈[-76,+57]mm · 3 of 24 slices shown]
[im 1/24]
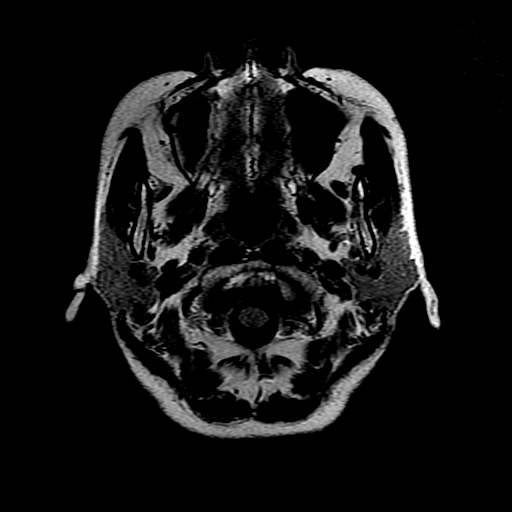
[im 12/24]
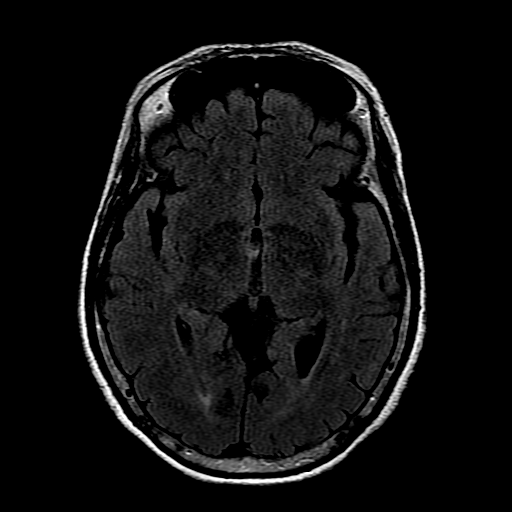
[im 24/24]
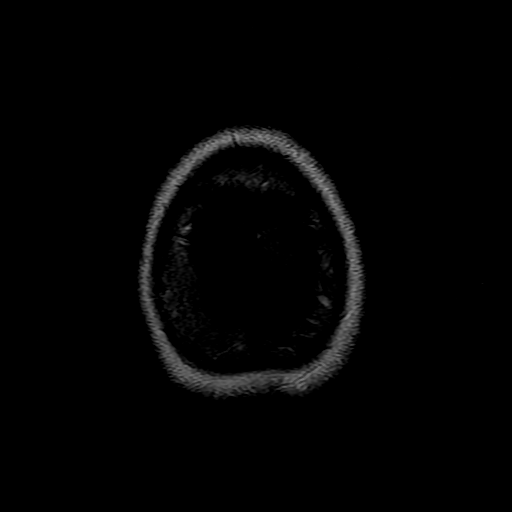

[Series 10: T2 · coronal · 5.0mm · 0.39mm/px · 3 of 26 slices shown (2 of 2)]
[im 1/26]
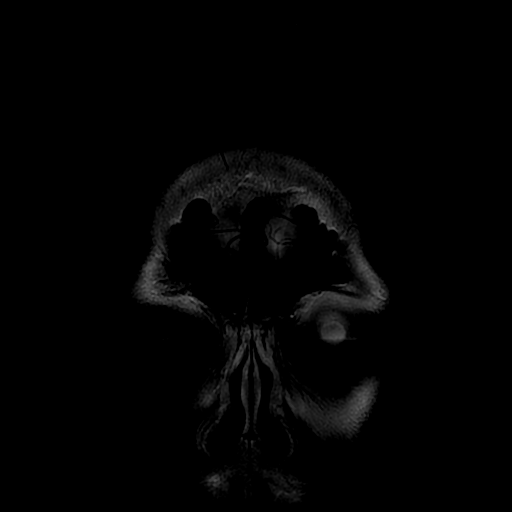
[im 13/26]
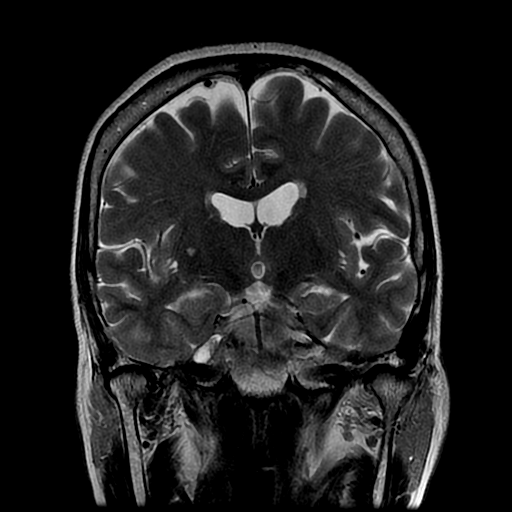
[im 26/26]
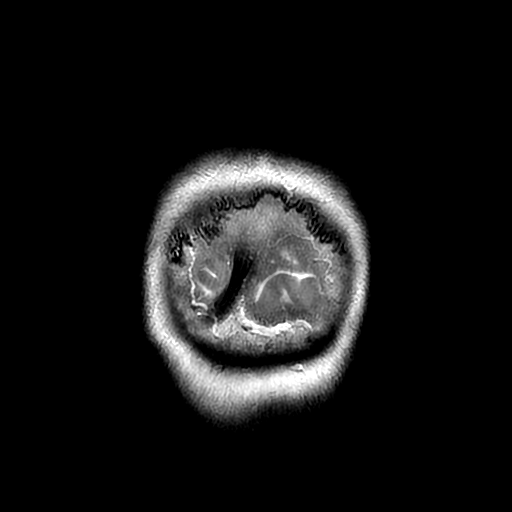

[Series 300: DWI · axial · 3.0mm · 1.09mm/px · z∈[-79,+52]mm · 5 of 46 slices shown (3 of 4)]
[im 1/46]
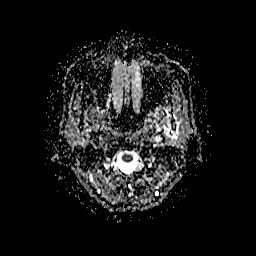
[im 12/46]
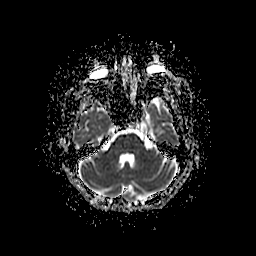
[im 23/46]
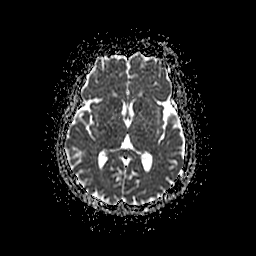
[im 34/46]
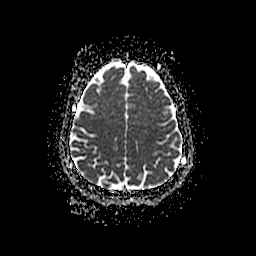
[im 46/46]
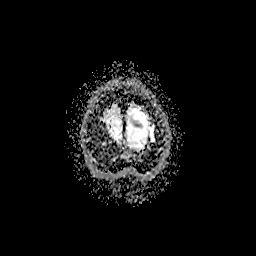

[Series 400: DWI · coronal · 5.0mm · 1.09mm/px · 4 of 34 slices shown (4 of 4)]
[im 1/34]
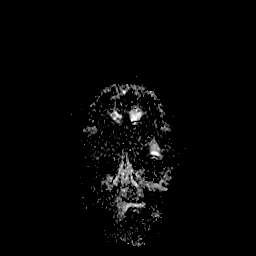
[im 12/34]
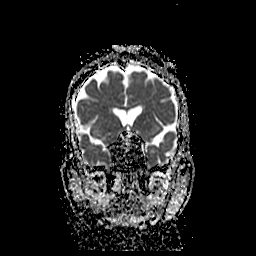
[im 23/34]
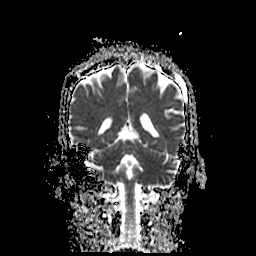
[im 34/34]
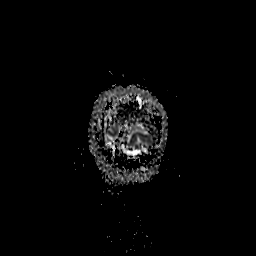

[36 of 48 positions shown; findings below may reference images not displayed]

FINDINGS: Brain: No acute infarction, hemorrhage, hydrocephalus, extra-axial
collection or mass lesion. Mild scattered T2/FLAIR hyperintensities
within the white matter, nonspecific but most likely related to
chronic microvascular ischemic disease. Partially empty sella.

Vascular: Major arterial flow voids are maintained at the skull
base. Small vertebrobasilar system with suspected fetal type PCAs.

Skull and upper cervical spine: Normal marrow signal.

Sinuses/Orbits: Mild scattered paranasal sinus mucosal thickening
without air-fluid levels.

Other: No mastoid effusions.
IMPRESSION: 1. No evidence of acute intracranial abnormality.  No acute infarct.
2. Mild chronic microvascular ischemic disease.
3. Partially empty sella. This may be an incidental finding, but can
be seen with idiopathic intracranial hypertension in the correct
clinical setting.

## 2021-09-10 ENCOUNTER — Encounter: Payer: Self-pay | Admitting: Internal Medicine

## 2021-09-10 ENCOUNTER — Other Ambulatory Visit: Payer: Self-pay | Admitting: Internal Medicine

## 2021-09-10 ENCOUNTER — Other Ambulatory Visit: Payer: Self-pay

## 2021-09-10 ENCOUNTER — Ambulatory Visit: Payer: Medicaid Other | Admitting: Internal Medicine

## 2021-09-10 VITALS — BP 142/82 | HR 80 | Resp 12 | Ht 60.5 in | Wt 94.0 lb

## 2021-09-10 DIAGNOSIS — Z0000 Encounter for general adult medical examination without abnormal findings: Secondary | ICD-10-CM | POA: Diagnosis not present

## 2021-09-10 DIAGNOSIS — Z23 Encounter for immunization: Secondary | ICD-10-CM

## 2021-09-10 DIAGNOSIS — I10 Essential (primary) hypertension: Secondary | ICD-10-CM

## 2021-09-10 DIAGNOSIS — Z79899 Other long term (current) drug therapy: Secondary | ICD-10-CM

## 2021-09-10 DIAGNOSIS — E7800 Pure hypercholesterolemia, unspecified: Secondary | ICD-10-CM

## 2021-09-10 DIAGNOSIS — Z114 Encounter for screening for human immunodeficiency virus [HIV]: Secondary | ICD-10-CM

## 2021-09-10 DIAGNOSIS — Z1231 Encounter for screening mammogram for malignant neoplasm of breast: Secondary | ICD-10-CM

## 2021-09-10 DIAGNOSIS — N189 Chronic kidney disease, unspecified: Secondary | ICD-10-CM | POA: Diagnosis not present

## 2021-09-10 DIAGNOSIS — H547 Unspecified visual loss: Secondary | ICD-10-CM | POA: Insufficient documentation

## 2021-09-10 DIAGNOSIS — Z124 Encounter for screening for malignant neoplasm of cervix: Secondary | ICD-10-CM

## 2021-09-10 DIAGNOSIS — Z780 Asymptomatic menopausal state: Secondary | ICD-10-CM | POA: Insufficient documentation

## 2021-09-10 DIAGNOSIS — Z1211 Encounter for screening for malignant neoplasm of colon: Secondary | ICD-10-CM

## 2021-09-10 DIAGNOSIS — Z1159 Encounter for screening for other viral diseases: Secondary | ICD-10-CM

## 2021-09-10 LAB — POCT URINALYSIS DIPSTICK
Bilirubin, UA: NEGATIVE
Blood, UA: NEGATIVE
Glucose, UA: NEGATIVE
Ketones, UA: NEGATIVE
Leukocytes, UA: NEGATIVE
Nitrite, UA: NEGATIVE
Protein, UA: NEGATIVE
Spec Grav, UA: 1.01 (ref 1.010–1.025)
Urobilinogen, UA: 0.2 E.U./dL
pH, UA: 7 (ref 5.0–8.0)

## 2021-09-10 MED ORDER — LOSARTAN POTASSIUM 100 MG PO TABS: 100.0000 mg | ORAL_TABLET | Freq: Every day | ORAL | 11 refills | Status: DC

## 2021-09-10 NOTE — Progress Notes (Signed)
Subjective:    Patient ID: Laura Harris, female   DOB: [birth year], 65 y.o.   MRN: [id number]   HPI  Adult son, Laura Harris, Laura Harris  CPE with pap  1.  Pap:  Never.    2.  Mammogram:  Never.  No family history of breast cancer.    3.  Osteoprevention:  Does drink milk twice daily, though likely not a full 8 oz--willing to do so.  Also eats cheese daily.  Walks daily and treadmill in the afternoon as well.  Has never had a bone density.  4.  Guaiac Cards/FIT:  Never.  5.  Colonoscopy:  Never.  No family history of colon cancer.    6.  Immunizations:  Has not had any COVID vaccinations--frightened of them.  Cannot say if she has had Td vaccination.  Has not had influenza vaccine this year.  7.  Glucose/Cholesterol:  history of mild hyperglycemia with high normal A1C earlier this year at 5.6%.  Hypercholesterolemia with quite high HDL of 80 Lipid Panel     Component Value Date/Time   CHOL 259 (H) 04/12/2021 1038   TRIG 114 04/12/2021 1038   HDL 80 04/12/2021 1038   CHOLHDL 3.3 04/02/2014 1011   VLDL 18 04/02/2014 1011   LDLCALC 159 (H) 04/12/2021 1038   LABVLDL 20 04/12/2021 1038     Current Meds  Medication Sig   amLODipine (NORVASC) 10 MG tablet Take 1 tablet (10 mg total) by mouth daily.   lisinopril (ZESTRIL) 20 MG tablet 1 tab by mouth daily.   meclizine (ANTIVERT) 25 MG tablet Take 1 tablet (25 mg total) by mouth 3 (three) times daily as needed for dizziness.   No Known Allergies  Past Medical History:  Diagnosis Date   Bilateral knee pain    CKD (chronic kidney disease) 2017   Hyperlipidemia 10/18/2020   Hypertension    No past surgical history on file.  Family History  Problem Relation Age of Onset   GER disease Mother    Migraines Father    Thyroid disease Sister        describe a very large goiter   Migraines Brother    Hypertension Brother    Kidney disease Brother    Mental illness Brother        He disappeared--do not know if alive    Diabetes Daughter    Anemia Daughter     Family Status  Relation Name Status   Mother  Deceased       unsure of age or cause   Father  Deceased       unsure of age or cause   Sister  Deceased   Brother  Alive       Lives in [place]   Brother  Deceased       Not clear cause of death   Brother  Other   Daughter  Alive       20s-30s--lives in [place]   Daughter  Alive       20s-married with children   Daughter  Alive       late teens-early 32s   Daughter  Alive       school age   [name]  Alive       lives in [place]   Son  Alive   Son  Alive   Son  Alive   Social History   Socioeconomic History   Marital status: Married    Spouse name: My Rahlan  Number of children: 8   Years of education: 0   Highest education level: Not on file  Occupational History   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  Laura Harris and sponsored her arrival.   Lives at home with Laura, 1 daughter, 3 sons and one of son's wives.     Men in family often do not live there as they are roofers and travel around a lot   Language:   - Jarai/[place]ard    - Requires intepreter (essentially speaks no [place])      Education:   - No formal education in [place]                        Social Determinants of Corporate investment banker Strain: Low Risk    Difficulty of Paying Living Expenses: Not hard at all  Food Insecurity: No Food Insecurity   Worried About Programme researcher, broadcasting/film/video in the Last Year: Never true   Barista in the Last Year: Never true  Transportation Needs: Not on file  Physical Activity: Not on file  Stress: Not on file  Social Connections: Not on file  Intimate Partner Violence: Not  At Risk   Fear of Current or Ex-Partner: No   Emotionally Abused: No   Physically Abused: No   Sexually Abused: No       Review of Systems  Eyes:  Positive for visual disturbance.  Respiratory:  Negative for shortness of breath (Only when she gets hot).   Cardiovascular:  Negative for chest pain and leg swelling.     Objective:   BP (!) 142/82 (BP Location: Left Arm, Patient Position: Sitting, Cuff Size: Normal)   Pulse 80   Resp 12   Ht 5' 0.5" (1.537 m)   Wt 94 lb (42.6 kg)   BMI 18.06 kg/m   Physical Exam Constitutional:      Appearance: She is normal weight.  HENT:     Head: Normocephalic and atraumatic.     Right Ear: Tympanic membrane, ear canal and external ear normal.     Left Ear: Tympanic membrane, ear canal and external ear normal.     Nose: Nose normal.     Mouth/Throat:     Mouth: Mucous membranes are moist.     Pharynx: Oropharynx is clear.  Eyes:     Extraocular Movements: Extraocular movements intact.     Conjunctiva/sclera: Conjunctivae normal.     Pupils: Pupils are equal, round, and reactive to light.     Funduscopic exam:    Right eye: Red reflex present.        Left eye: Red reflex present.    Comments: Discs sharp bilaterally.  Neck:     Thyroid: No thyroid mass or thyromegaly.  Cardiovascular:     Rate and Rhythm: Normal rate and regular rhythm.     Heart sounds: S1 normal and S2 normal. No murmur heard.   No friction rub. No S3 sounds.     Comments: No carotid bruits.  Carotid, radial, femoral, DP and PT pulses normal and equal.    Pulmonary:  Effort: Pulmonary effort is normal.     Breath sounds: Normal breath sounds and air entry.     Comments: Dry crackles at left base resolve with deep breathing Chest:  Breasts:    Right: No inverted nipple, mass, nipple discharge or skin change.     Left: No inverted nipple, mass, nipple discharge or skin change.  Abdominal:     General: Bowel sounds are normal.     Palpations: Abdomen is  soft. There is no hepatomegaly, splenomegaly or mass.     Tenderness: There is no abdominal tenderness.     Hernia: No hernia is present.  Genitourinary:    Comments: Atrophic external and internal female genitalia. No vaginal or cervical lesions. No uterine or adnexal mass or tenderness. Musculoskeletal:        General: Normal range of motion.     Cervical back: Normal range of motion and neck supple.     Right lower leg: No edema.     Left lower leg: No edema.  Lymphadenopathy:     Head:     Right side of head: No submental or submandibular adenopathy.     Left side of head: No submental or submandibular adenopathy.     Cervical: No cervical adenopathy.     Upper Body:     Right upper body: No supraclavicular or axillary adenopathy.     Left upper body: No supraclavicular or axillary adenopathy.     Lower Body: No right inguinal adenopathy. No left inguinal adenopathy.  Skin:    General: Skin is warm.     Capillary Refill: Capillary refill takes less than 2 seconds.     Findings: No rash.  Neurological:     General: No focal deficit present.     Mental Status: She is alert and oriented to person, place, and time.     Cranial Nerves: Cranial nerves 2-12 are intact.     Sensory: Sensation is intact.     Motor: Motor function is intact.     Coordination: Coordination is intact.     Gait: Gait is intact.     Deep Tendon Reflexes: Reflexes are normal and symmetric.  Psychiatric:        Attention and Perception: Attention normal.        Behavior: Behavior normal. Behavior is cooperative.     Assessment & Plan    CPE with pap Schedule mammogram Referral for screening colonoscopy. DEXA Bone Density CBC, CMP, HIV and Hep C screen. COVID vaccine #1 with Pfizer, return in 3 weeks for 2nd. No influenza available today--obtain at follow up with COVID vaccination or at pharmacy of choice.  2.  Hypertension with CKD:  CMP.  BP not at goal.  Switch to Losartan 100 mg daily from  Lisinopril.  BP check in 1 month.    3.  Hypercholesterolemia with high HDL:  repeat FLP.  4.  Decreased visual acuity, Left worse than right:  Dr. Dione Booze referral.

## 2021-09-11 LAB — LIPID PANEL W/O CHOL/HDL RATIO
Cholesterol, Total: 223 mg/dL — ABNORMAL HIGH (ref 100–199)
HDL: 74 mg/dL (ref >39)
LDL Chol Calc (NIH): 134 mg/dL — ABNORMAL HIGH (ref 0–99)
Triglycerides: 86 mg/dL (ref 0–149)
VLDL Cholesterol Cal: 15 mg/dL (ref 5–40)

## 2021-09-11 LAB — COMPREHENSIVE METABOLIC PANEL
ALT: 11 IU/L (ref 0–32)
AST: 19 IU/L (ref 0–40)
Albumin/Globulin Ratio: 1.6 (ref 1.2–2.2)
Albumin: 5 g/dL — ABNORMAL HIGH (ref 3.8–4.8)
Alkaline Phosphatase: 70 IU/L (ref 44–121)
BUN/Creatinine Ratio: 17 (ref 12–28)
BUN: 28 mg/dL — ABNORMAL HIGH (ref 8–27)
Bilirubin Total: 0.4 mg/dL (ref 0.0–1.2)
CO2: 24 mmol/L (ref 20–29)
Calcium: 9.3 mg/dL (ref 8.7–10.3)
Chloride: 102 mmol/L (ref 96–106)
Creatinine, Ser: 1.61 mg/dL — ABNORMAL HIGH (ref 0.57–1.00)
Globulin, Total: 3.1 g/dL (ref 1.5–4.5)
Glucose: 94 mg/dL (ref 70–99)
Potassium: 5.4 mmol/L — ABNORMAL HIGH (ref 3.5–5.2)
Sodium: 139 mmol/L (ref 134–144)
Total Protein: 8.1 g/dL (ref 6.0–8.5)
eGFR: 36 mL/min/{1.73_m2} — ABNORMAL LOW (ref >59)

## 2021-09-11 LAB — CBC WITH DIFFERENTIAL/PLATELET
Basophils Absolute: 0 10*3/uL (ref 0.0–0.2)
Basos: 1 %
EOS (ABSOLUTE): 0.2 10*3/uL (ref 0.0–0.4)
Eos: 5 %
Hematocrit: 34 % (ref 34.0–46.6)
Hemoglobin: 10.7 g/dL — ABNORMAL LOW (ref 11.1–15.9)
Immature Grans (Abs): 0 10*3/uL (ref 0.0–0.1)
Immature Granulocytes: 0 %
Lymphocytes Absolute: 1 10*3/uL (ref 0.7–3.1)
Lymphs: 26 %
MCH: 25.3 pg — ABNORMAL LOW (ref 26.6–33.0)
MCHC: 31.5 g/dL (ref 31.5–35.7)
MCV: 80 fL (ref 79–97)
Monocytes Absolute: 0.2 10*3/uL (ref 0.1–0.9)
Monocytes: 5 %
Neutrophils Absolute: 2.6 10*3/uL (ref 1.4–7.0)
Neutrophils: 63 %
Platelets: 164 10*3/uL (ref 150–450)
RBC: 4.23 x10E6/uL (ref 3.77–5.28)
RDW: 13 % (ref 11.7–15.4)
WBC: 4 10*3/uL (ref 3.4–10.8)

## 2021-09-11 LAB — HEPATITIS C ANTIBODY: Hep C Virus Ab: <0.1 s/co ratio (ref 0.0–0.9)

## 2021-09-11 LAB — HIV ANTIBODY (ROUTINE TESTING W REFLEX): HIV Screen 4th Generation wRfx: NONREACTIVE

## 2021-09-13 LAB — CYTOLOGY - PAP

## 2021-10-01 ENCOUNTER — Other Ambulatory Visit: Payer: Self-pay

## 2021-10-01 ENCOUNTER — Ambulatory Visit (INDEPENDENT_AMBULATORY_CARE_PROVIDER_SITE_OTHER): Payer: Medicaid Other

## 2021-10-01 ENCOUNTER — Ambulatory Visit (INDEPENDENT_AMBULATORY_CARE_PROVIDER_SITE_OTHER): Payer: Medicaid Other | Admitting: Internal Medicine

## 2021-10-01 VITALS — BP 168/88

## 2021-10-01 DIAGNOSIS — Z23 Encounter for immunization: Secondary | ICD-10-CM

## 2021-10-01 DIAGNOSIS — Z0130 Encounter for examination of blood pressure without abnormal findings: Secondary | ICD-10-CM

## 2021-10-01 MED ORDER — METOPROLOL SUCCINATE ER 25 MG PO TB24: 25.0000 mg | ORAL_TABLET | Freq: Every day | ORAL | 11 refills | Status: DC

## 2021-10-01 NOTE — Progress Notes (Signed)
Patient reported taking bp medication consistently. Took medication this morning. Will start on metoprolol 25mg  daily with bp recheck and bmp in two weeks

## 2021-10-08 ENCOUNTER — Telehealth: Payer: Self-pay

## 2021-10-08 NOTE — Telephone Encounter (Signed)
Patient called asking for medication refill for 3 months. Will be traveling out of the country for 3 months

## 2021-10-13 MED ORDER — AMLODIPINE BESYLATE 10 MG PO TABS: 10.0000 mg | ORAL_TABLET | Freq: Every day | ORAL | 3 refills | Status: DC

## 2021-10-21 ENCOUNTER — Other Ambulatory Visit: Payer: Medicaid Other

## 2021-11-21 DIAGNOSIS — M810 Age-related osteoporosis without current pathological fracture: Secondary | ICD-10-CM | POA: Insufficient documentation

## 2021-12-31 ENCOUNTER — Encounter: Payer: Self-pay | Admitting: Gastroenterology

## 2022-02-14 ENCOUNTER — Other Ambulatory Visit: Payer: Medicaid Other

## 2022-02-22 ENCOUNTER — Ambulatory Visit: Payer: Medicaid Other

## 2022-02-23 ENCOUNTER — Other Ambulatory Visit: Payer: Medicaid Other

## 2022-03-04 ENCOUNTER — Ambulatory Visit: Payer: Medicaid Other | Admitting: Internal Medicine

## 2022-03-07 ENCOUNTER — Ambulatory Visit: Payer: Medicaid Other | Admitting: Gastroenterology

## 2022-04-28 ENCOUNTER — Ambulatory Visit: Payer: Self-pay | Admitting: Internal Medicine

## 2022-04-28 ENCOUNTER — Encounter: Payer: Self-pay | Admitting: Internal Medicine

## 2022-04-28 VITALS — BP 152/86 | HR 72 | Resp 12 | Ht 60.5 in | Wt 96.0 lb

## 2022-04-28 DIAGNOSIS — E7800 Pure hypercholesterolemia, unspecified: Secondary | ICD-10-CM

## 2022-04-28 DIAGNOSIS — I10 Essential (primary) hypertension: Secondary | ICD-10-CM

## 2022-04-28 DIAGNOSIS — N189 Chronic kidney disease, unspecified: Secondary | ICD-10-CM

## 2022-04-28 DIAGNOSIS — D649 Anemia, unspecified: Secondary | ICD-10-CM

## 2022-04-28 DIAGNOSIS — E875 Hyperkalemia: Secondary | ICD-10-CM

## 2022-04-28 MED ORDER — LOSARTAN POTASSIUM 100 MG PO TABS: ORAL_TABLET | ORAL | 3 refills | Status: DC

## 2022-04-28 MED ORDER — METOPROLOL SUCCINATE ER 25 MG PO TB24: ORAL_TABLET | ORAL | 3 refills | Status: DC

## 2022-04-28 MED ORDER — AMLODIPINE BESYLATE 10 MG PO TABS
ORAL_TABLET | ORAL | 3 refills | Status: DC
Start: 2022-04-28 — End: 2023-04-26

## 2022-04-28 NOTE — Progress Notes (Signed)
    Subjective:    Patient ID: Laura Harris, female   DOB: 05-Dec-1955, 66 y.o.   MRN: [id number]   HPI  Daughter, interprets in [place].     Hypertension/CKD:  Not taking Losartan nor Metoprolol.  She ran out when visiting in [place] as stayed 3 months longer than planned and only took 3 months with her.  Apparently, stretched out her Amlodipine.  Has been back for a month and only taking Amlodipine on and off--last was at 7 a.m. today.  She has been chronically not controlled and have difficulty with having her take meds regularly  2.  Normocytic Anemia:  never returned FIT.  Did not get colonoscopy, diagnostic, scheduled as last planned, for April.  Also, never returned for iron studies/anemia work up before left country.    3.  Hyperkalemia:  never returned for recheck.    Current Meds  Medication Sig   amLODipine (NORVASC) 10 MG tablet Take 1 tablet (10 mg total) by mouth daily.   No Known Allergies   Review of Systems    Objective:   BP (!) 152/86 (BP Location: Right Arm, Patient Position: Sitting, Cuff Size: Normal)   Pulse 72   Resp 12   Ht 5' 0.5" (1.537 m)   Wt 96 lb (43.5 kg)   BMI 18.44 kg/m   Physical Exam NAD Lungs:  CTA CV:  RRR without murmur or rub  No carotid bruits, carotid, radial and DP pulses normal and equal. LE:  No edema   Assessment & Plan   Hypertension/CKD:  not controlled as not taking meds.  Encouraged her to make sure she never runs out of meds.  Refilled.  CMP  2.  Normocytic anemia:  iron studies, RBC folate, B12 and CBC Hold on recommended vaccines today --will cover at next visit.  3.  Hyperkalemia:  CMP  4.  Hypercholesterolemia:  FLP  5.  HM:  hold on vaccines today.

## 2022-04-28 NOTE — Patient Instructions (Signed)
[employer] in [place], [place] Address: [address], [place], [place] [address] Phone: [phone]

## 2022-04-29 LAB — COMPREHENSIVE METABOLIC PANEL
ALT: 14 IU/L (ref 0–32)
AST: 22 IU/L (ref 0–40)
Albumin/Globulin Ratio: 1.6 (ref 1.2–2.2)
Albumin: 4.6 g/dL (ref 3.8–4.8)
Alkaline Phosphatase: 67 IU/L (ref 44–121)
BUN/Creatinine Ratio: 19 (ref 12–28)
BUN: 21 mg/dL (ref 8–27)
Bilirubin Total: 0.7 mg/dL (ref 0.0–1.2)
CO2: 22 mmol/L (ref 20–29)
Calcium: 9.2 mg/dL (ref 8.7–10.3)
Chloride: 102 mmol/L (ref 96–106)
Creatinine, Ser: 1.11 mg/dL — ABNORMAL HIGH (ref 0.57–1.00)
Globulin, Total: 2.8 g/dL (ref 1.5–4.5)
Glucose: 73 mg/dL (ref 70–99)
Potassium: 6.1 mmol/L — ABNORMAL HIGH (ref 3.5–5.2)
Sodium: 138 mmol/L (ref 134–144)
Total Protein: 7.4 g/dL (ref 6.0–8.5)
eGFR: 56 mL/min/{1.73_m2} — ABNORMAL LOW (ref >59)

## 2022-04-29 LAB — CBC WITH DIFFERENTIAL/PLATELET
Basophils Absolute: 0 10*3/uL (ref 0.0–0.2)
Basos: 1 %
EOS (ABSOLUTE): 0.1 10*3/uL (ref 0.0–0.4)
Eos: 3 %
Hematocrit: 33.4 % — ABNORMAL LOW (ref 34.0–46.6)
Hemoglobin: 10.7 g/dL — ABNORMAL LOW (ref 11.1–15.9)
Immature Grans (Abs): 0 10*3/uL (ref 0.0–0.1)
Immature Granulocytes: 0 %
Lymphocytes Absolute: 1.2 10*3/uL (ref 0.7–3.1)
Lymphs: 29 %
MCH: 24.1 pg — ABNORMAL LOW (ref 26.6–33.0)
MCHC: 32 g/dL (ref 31.5–35.7)
MCV: 75 fL — ABNORMAL LOW (ref 79–97)
Monocytes Absolute: 0.3 10*3/uL (ref 0.1–0.9)
Monocytes: 6 %
Neutrophils Absolute: 2.5 10*3/uL (ref 1.4–7.0)
Neutrophils: 61 %
Platelets: 147 10*3/uL — ABNORMAL LOW (ref 150–450)
RBC: 4.44 x10E6/uL (ref 3.77–5.28)
RDW: 13.3 % (ref 11.7–15.4)
WBC: 4.1 10*3/uL (ref 3.4–10.8)

## 2022-04-29 LAB — LIPID PANEL W/O CHOL/HDL RATIO
Cholesterol, Total: 216 mg/dL — ABNORMAL HIGH (ref 100–199)
HDL: 47 mg/dL (ref >39)
LDL Chol Calc (NIH): 143 mg/dL — ABNORMAL HIGH (ref 0–99)
Triglycerides: 146 mg/dL (ref 0–149)
VLDL Cholesterol Cal: 26 mg/dL (ref 5–40)

## 2022-04-29 LAB — FOLATE RBC
Folate, Hemolysate: 502 ng/mL
Folate, RBC: 1503 ng/mL (ref >498)

## 2022-04-29 LAB — VITAMIN B12: Vitamin B-12: 1385 pg/mL — ABNORMAL HIGH (ref 232–1245)

## 2022-04-29 LAB — IRON AND TIBC
Iron Saturation: 38 % (ref 15–55)
Iron: 98 ug/dL (ref 27–139)
Total Iron Binding Capacity: 258 ug/dL (ref 250–450)
UIBC: 160 ug/dL (ref 118–369)

## 2022-05-03 ENCOUNTER — Other Ambulatory Visit: Payer: Medicaid Other

## 2022-05-05 ENCOUNTER — Other Ambulatory Visit (INDEPENDENT_AMBULATORY_CARE_PROVIDER_SITE_OTHER): Payer: Medicaid Other

## 2022-05-05 DIAGNOSIS — Z79899 Other long term (current) drug therapy: Secondary | ICD-10-CM | POA: Diagnosis not present

## 2022-05-05 DIAGNOSIS — D649 Anemia, unspecified: Secondary | ICD-10-CM

## 2022-05-06 LAB — BASIC METABOLIC PANEL
BUN/Creatinine Ratio: 13 (ref 12–28)
BUN: 13 mg/dL (ref 8–27)
CO2: 27 mmol/L (ref 20–29)
Calcium: 9.1 mg/dL (ref 8.7–10.3)
Chloride: 101 mmol/L (ref 96–106)
Creatinine, Ser: 1 mg/dL (ref 0.57–1.00)
Glucose: 87 mg/dL (ref 70–99)
Potassium: 3.9 mmol/L (ref 3.5–5.2)
Sodium: 141 mmol/L (ref 134–144)
eGFR: 63 mL/min/{1.73_m2} (ref >59)

## 2022-05-06 LAB — LACTATE DEHYDROGENASE: LDH: 163 IU/L (ref 119–226)

## 2022-05-13 ENCOUNTER — Other Ambulatory Visit (INDEPENDENT_AMBULATORY_CARE_PROVIDER_SITE_OTHER): Payer: Medicaid Other

## 2022-05-13 DIAGNOSIS — Z1211 Encounter for screening for malignant neoplasm of colon: Secondary | ICD-10-CM | POA: Diagnosis not present

## 2022-05-13 LAB — POC FIT TEST STOOL: Fecal Occult Blood: NEGATIVE

## 2022-05-18 ENCOUNTER — Encounter: Payer: Self-pay | Admitting: Gastroenterology

## 2022-05-18 ENCOUNTER — Ambulatory Visit: Payer: Medicaid Other | Admitting: Gastroenterology

## 2022-05-18 ENCOUNTER — Other Ambulatory Visit: Payer: Medicaid Other

## 2022-05-18 VITALS — BP 146/80 | HR 64 | Ht 60.5 in | Wt 96.0 lb

## 2022-05-18 DIAGNOSIS — D649 Anemia, unspecified: Secondary | ICD-10-CM | POA: Insufficient documentation

## 2022-05-18 DIAGNOSIS — K5900 Constipation, unspecified: Secondary | ICD-10-CM

## 2022-05-18 MED ORDER — CITRUCEL PO POWD: 1.0000 | Freq: Every day | ORAL | Status: DC

## 2022-05-18 NOTE — Progress Notes (Signed)
HPI: This is a very pleasant 66 year old [place] woman who was referred to me by Julieanne Manson, MD  to evaluate constipation.    She is here with her daughter and also a professional interpreter  She is a constipation and troubles with her bowels for at least 10 years.  She will often only move her bowels once per week.  She will have to strain most of the time to get her stools.  She sees blood periodically.  Her weight is overall stable.  She does not know of colon cancer runs in her family  She has never had colon cancer screening that she is aware of  Old Data Reviewed: Blood work 04/2022 hemoglobin 10.6, MCV 75, iron studies are normal.  Complete metabolic profile was normal except for potassium of 6  FOB testing 04/2022 was negative   Review of systems: Pertinent positive and negative review of systems were noted in the above HPI section. All other review negative.   Past Medical History:  Diagnosis Date   Bilateral knee pain    CKD (chronic kidney disease) 2017   Hyperlipidemia 10/18/2020   Hypertension     Past Surgical History:  Procedure Laterality Date   NO PAST SURGERIES      Current Outpatient Medications  Medication Instructions   amLODipine (NORVASC) 10 MG tablet 1 tab by mouth daily with morning meal   metoprolol succinate (TOPROL-XL) 25 MG 24 hr tablet 1 tab by mouth daily with evening meal.    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  Husband was refugee and sponsored her arrival.   Lives at home with husband, 1 daughter, 3 sons and one of son's wives.     Men in family often do not live there as they are roofers and travel around a lot   Language:   - Jarai/Montagnard    - Requires intepreter (essentially speaks no [place])      Education:   - No formal education in [place]                        Social Determinants of Health   Financial Resource Strain: Low Risk  (09/10/2021)   Overall Financial Resource Strain (CARDIA)    Difficulty of Paying Living Expenses: Not hard  at all  Food Insecurity: No Food Insecurity (09/10/2021)   Hunger Vital Sign    Worried About Running Out of Food in the Last Year: Never true    Ran Out of Food in the Last Year: Never true  Transportation Needs: No Transportation Needs (08/28/2020)   PRAPARE - Administrator, Civil Service (Medical): No    Lack of Transportation (Non-Medical): No  Physical Activity: Not on file  Stress: No Stress Concern Present (08/28/2020)   [employer] of Occupational Health - Occupational Stress Questionnaire    Feeling of Stress : Not at all  Social Connections: Moderately Integrated (08/28/2020)   Social Connection and Isolation Panel [NHANES]    Frequency of Communication with Friends and Family: Once a week    Frequency of Social Gatherings with Friends and Family: More than three times a week    Attends Religious Services: More than 4 times per year    Active Member of [employer] or Organizations: No    Attends Tax inspector Meetings: Never    Marital Status: Married  Catering manager Violence: Not At Risk (09/10/2021)   Humiliation, Afraid, Rape, and Kick questionnaire    Fear of Current or Ex-Partner: No    Emotionally Abused: No    Physically Abused: No    Sexually Abused: No     Physical Exam: BP (!) 146/80 (BP Location: Left Arm, Patient Position: Sitting, Cuff Size: Normal)   Pulse 64   Ht 5' 0.5" (1.537 m) Comment: height measured without shoes  Wt 96 lb 0.8 oz (43.6 kg)   BMI 18.45 kg/m  Constitutional: generally well-appearing Psychiatric: alert and oriented x3 Eyes: extraocular movements intact Mouth: oral pharynx moist, no lesions Neck: supple no lymphadenopathy Cardiovascular: heart regular rate and rhythm Lungs: clear to auscultation bilaterally Abdomen: soft, nontender, nondistended, no obvious ascites, no peritoneal signs, normal bowel sounds Extremities: no lower extremity edema bilaterally Skin: no lesions on visible extremities   Assessment and plan: 66 y.o. female with chronic constipation, mild intermittent rectal bleeding  She has had these problems for over 10 years.  I doubt this is from anything serious.  I recommended daily fiber supplements with over-the-counter Citrucel or Metamucil powder and a large glass of water every day.  I also recommended a colonoscopy to exclude potential anatomic causes and will also act as a colon cancer screening test.  Please see the "Patient Instructions" section for addition details about the plan.   Rob Bunting, MD [facility] 05/18/2022, 10:46 AM  Cc: Julieanne Manson, MD  Total time on date of encounter was 46  minutes (this included time spent preparing to see the patient reviewing records; obtaining and/or reviewing separately obtained history; performing a medically appropriate exam and/or evaluation; counseling and educating the patient and family if present; ordering medications, tests or  procedures if applicable; and documenting clinical information in the health record).

## 2022-05-18 NOTE — Patient Instructions (Addendum)
If you are age 64 or younger, your body mass index should be between 19-25. Your Body mass index is 18.45 kg/m. If this is out of the aformentioned range listed, please consider follow up with your Primary Care Provider.  ________________________________________________________  The [place] [facility] providers would like to encourage you to use [facility] to communicate with providers for non-urgent requests or questions.  Due to long hold times on the telephone, sending your provider a message by [facility] may be a faster and more efficient way to get a response.  Please allow 48 business hours for a response.  Please remember that this is for non-urgent requests.  _______________________________________________________  Laura Harris have been scheduled for a colonoscopy. Please follow written instructions given to you at your visit today.  Please pick up your prep supplies at the pharmacy within the next 1-3 days. If you use inhalers (even only as needed), please bring them with you on the day of your procedure.  Due to recent changes in healthcare laws, you may see the results of your imaging and laboratory studies on MyChart before your provider has had a chance to review them.  We understand that in some cases there may be results that are confusing or concerning to you. Not all laboratory results come back in the same time frame and the provider may be waiting for multiple results in order to interpret others.  Please give [place] 48 hours in order for your provider to thoroughly review all the results before contacting the office for clarification of your results.   Please start taking citrucel (orange flavored) powder fiber supplement.  This may cause some bloating at first but that usually goes away. Begin with a small spoonful and work your way up to a large, heaping spoonful daily over a week.  Thank you for entrusting me with your care and choosing [facility].  Dr Christella Hartigan

## 2022-06-30 ENCOUNTER — Encounter (HOSPITAL_COMMUNITY): Payer: Self-pay

## 2022-06-30 ENCOUNTER — Ambulatory Visit (HOSPITAL_COMMUNITY)
Admission: RE | Admit: 2022-06-30 | Discharge: 2022-06-30 | Disposition: A | Discharge status: Home / Self Care | Payer: Medicaid Other | Source: Ambulatory Visit | Attending: Internal Medicine | Admitting: Internal Medicine

## 2022-06-30 VITALS — BP 161/75 | HR 68 | Temp 97.8°F | Resp 18

## 2022-06-30 DIAGNOSIS — B354 Tinea corporis: Secondary | ICD-10-CM | POA: Diagnosis not present

## 2022-06-30 LAB — COMPREHENSIVE METABOLIC PANEL
ALT: 16 U/L (ref 0–44)
AST: 20 U/L (ref 15–41)
Albumin: 4.4 g/dL (ref 3.5–5.0)
Alkaline Phosphatase: 59 U/L (ref 38–126)
Anion gap: 5 (ref 5–15)
BUN: 28 mg/dL — ABNORMAL HIGH (ref 8–23)
CO2: 27 mmol/L (ref 22–32)
Calcium: 9.3 mg/dL (ref 8.9–10.3)
Chloride: 108 mmol/L (ref 98–111)
Creatinine, Ser: 1.06 mg/dL — ABNORMAL HIGH (ref 0.44–1.00)
GFR, Estimated: 59 mL/min — ABNORMAL LOW (ref >60)
Glucose, Bld: 93 mg/dL (ref 70–99)
Potassium: 4.8 mmol/L (ref 3.5–5.1)
Sodium: 140 mmol/L (ref 135–145)
Total Bilirubin: 0.8 mg/dL (ref 0.3–1.2)
Total Protein: 7.4 g/dL (ref 6.5–8.1)

## 2022-06-30 LAB — CBC WITH DIFFERENTIAL/PLATELET
Abs Immature Granulocytes: 0.01 10*3/uL (ref 0.00–0.07)
Basophils Absolute: 0 10*3/uL (ref 0.0–0.1)
Basophils Relative: 0 %
Eosinophils Absolute: 0.1 10*3/uL (ref 0.0–0.5)
Eosinophils Relative: 4 %
HCT: 31.9 % — ABNORMAL LOW (ref 36.0–46.0)
Hemoglobin: 10 g/dL — ABNORMAL LOW (ref 12.0–15.0)
Immature Granulocytes: 0 %
Lymphocytes Relative: 26 %
Lymphs Abs: 1 10*3/uL (ref 0.7–4.0)
MCH: 25.3 pg — ABNORMAL LOW (ref 26.0–34.0)
MCHC: 31.3 g/dL (ref 30.0–36.0)
MCV: 80.8 fL (ref 80.0–100.0)
Monocytes Absolute: 0.3 10*3/uL (ref 0.1–1.0)
Monocytes Relative: 7 %
Neutro Abs: 2.5 10*3/uL (ref 1.7–7.7)
Neutrophils Relative %: 63 %
Platelets: 134 10*3/uL — ABNORMAL LOW (ref 150–400)
RBC: 3.95 MIL/uL (ref 3.87–5.11)
RDW: 13.4 % (ref 11.5–15.5)
WBC: 3.9 10*3/uL — ABNORMAL LOW (ref 4.0–10.5)
nRBC: 0 % (ref 0.0–0.2)

## 2022-06-30 MED ORDER — TERBINAFINE HCL 250 MG PO TABS: 250.0000 mg | ORAL_TABLET | Freq: Every day | ORAL | 0 refills | Status: DC

## 2022-06-30 MED ORDER — TRIAMCINOLONE ACETONIDE 0.1 % EX CREA: 1.0000 | TOPICAL_CREAM | Freq: Two times a day (BID) | CUTANEOUS | 0 refills | Status: DC

## 2022-06-30 MED ORDER — HYDROXYZINE HCL 25 MG PO TABS: 25.0000 mg | ORAL_TABLET | Freq: Three times a day (TID) | ORAL | 0 refills | Status: DC | PRN

## 2022-06-30 NOTE — ED Triage Notes (Signed)
Pt reports a rash on neck, chest area and arms x 1 month. States the area is very itchy. Has tried an OTC medication but unsure of the name.

## 2022-06-30 NOTE — Discharge Instructions (Addendum)
Please use medications as prescribed Use mild soap and detergent for bathing and laundering clothes respectively If symptoms persist after medications are exhausted please return to urgent care to be reevaluated. Will call you with recommendations if labs are abnormal.

## 2022-07-02 NOTE — ED Provider Notes (Signed)
[facility]    CSN: [id number] Arrival date & time: 06/30/22  1103      History   Chief Complaint Chief Complaint  Patient presents with   Rash    Entered by patient   Pruritis    HPI Laura Harris is a 66 y.o. female comes to the urgent care with a 1 month history of intensely pruritic rash over the neck, upper chest and arms.  Symptoms started insidiously and has worsened over the past month.  Itchy areas associated with the rash.  Over-the-counter medications have not helped with the itching or the rash.  No fever or chills.  She denies any redness associated with the rash.  No changes in soap, cosmetics or detergents. HPI  Past Medical History:  Diagnosis Date   Bilateral knee pain    CKD (chronic kidney disease) 2017   Hyperlipidemia 10/18/2020   Hypertension     Patient Active Problem List   Diagnosis Date Noted   Postmenopausal 09/10/2021   Decreased visual acuity 09/10/2021   Hyperglycemia 07/05/2021   Hypercholesterolemia 08/28/2020   CKD (chronic kidney disease) 2017   Thyroid nodule 03/12/2014   Primary hypertension 10/28/2013   Stomach pain 10/28/2013   Headache(784.0) 10/28/2013    Past Surgical History:  Procedure Laterality Date   NO PAST SURGERIES      OB History   No obstetric history on file.      Home Medications    Prior to Admission medications   Medication Sig Start Date End Date Taking? Authorizing Provider  hydrOXYzine (ATARAX) 25 MG tablet Take 1 tablet (25 mg total) by mouth every 8 (eight) hours as needed. 06/30/22  Yes Joshia Kitchings, Britta Mccreedy, MD  terbinafine (LAMISIL) 250 MG tablet Take 1 tablet (250 mg total) by mouth daily. 06/30/22  Yes Adrian Dinovo, Britta Mccreedy, MD  triamcinolone cream (KENALOG) 0.1 % Apply 1 Application topically 2 (two) times daily. 06/30/22  Yes Paden Kuras, Britta Mccreedy, MD  amLODipine (NORVASC) 10 MG tablet 1 tab by mouth daily with morning meal 04/28/22   Julieanne Manson, MD  methylcellulose (CITRUCEL) oral powder  Take 1 packet by mouth daily. 05/18/22   Rachael Fee, MD  metoprolol succinate (TOPROL-XL) 25 MG 24 hr tablet 1 tab by mouth daily with evening meal. 04/28/22   Julieanne Manson, MD    Family History Family History  Problem Relation Age of Onset   GER disease Mother    Migraines Father    Thyroid disease Sister        describe a very large goiter   Migraines Brother    Hypertension Brother    Kidney disease Brother    Mental illness Brother        He disappeared--do not know if alive   Diabetes Daughter    Anemia Daughter     Social History Social History   Tobacco Use   Smoking status: Never   Smokeless tobacco: Never  Vaping Use   Vaping Use: Never used  Substance Use Topics   Alcohol use: No   Drug use: No     Allergies   Patient has no known allergies.   Review of Systems Review of Systems  Cardiovascular: Negative.   Gastrointestinal: Negative.   Musculoskeletal: Negative.   Skin:  Positive for rash. Negative for color change and wound.     Physical Exam Triage Vital Signs ED Triage Vitals  Enc Vitals Group     BP 06/30/22 1118 (!) 161/75  Pulse Rate 06/30/22 1118 68     Resp 06/30/22 1118 18     Temp 06/30/22 1118 97.8 F (36.6 C)     Temp Source 06/30/22 1118 Oral     SpO2 06/30/22 1118 98 %     Weight --      Height --      Head Circumference --      Peak Flow --      Pain Score 06/30/22 1116 0     Pain Loc --      Pain Edu? --      Excl. in GC? --    No data found.  Updated Vital Signs BP (!) 161/75 (BP Location: Right Arm)   Pulse 68   Temp 97.8 F (36.6 C) (Oral)   Resp 18   SpO2 98%   Visual Acuity Right Eye Distance:   Left Eye Distance:   Bilateral Distance:    Right Eye Near:   Left Eye Near:    Bilateral Near:     Physical Exam Vitals and nursing note reviewed.  Constitutional:      General: She is not in acute distress.    Appearance: Normal appearance. She is not ill-appearing.  Cardiovascular:      Rate and Rhythm: Normal rate and regular rhythm.  Musculoskeletal:        General: Normal range of motion.  Skin:    Comments: Well-circumscribed rash over the neck upper arms and upper chest.  No rashes are oval or circular in shape and measures from half an inch to an inch and a half in the longest diameter.  No overlying erythema.  Some of the rashes on the upper extremities has central clearing.  No vesicles or blisters noted  Neurological:     Mental Status: She is alert.      UC Treatments / Results  Labs (all labs ordered are listed, but only abnormal results are displayed) Labs Reviewed  CBC WITH DIFFERENTIAL/PLATELET - Abnormal; Notable for the following components:      Result Value   WBC 3.9 (*)    Hemoglobin 10.0 (*)    HCT 31.9 (*)    MCH 25.3 (*)    Platelets 134 (*)    All other components within normal limits  COMPREHENSIVE METABOLIC PANEL - Abnormal; Notable for the following components:   BUN 28 (*)    Creatinine, Ser 1.06 (*)    GFR, Estimated 59 (*)    All other components within normal limits    EKG   Radiology No results found.  Procedures Procedures (including critical care time)  Medications Ordered in UC Medications - No data to display  Initial Impression / Assessment and Plan / UC Course  I have reviewed the triage vital signs and the nursing notes.  Pertinent labs & imaging results that were available during my care of the patient were reviewed by me and considered in my medical decision making (see chart for details).     1.  Tinea corporis: Failed topical antifungal/steroid therapy Lamisil 250 mg orally daily for 10 days Hydroxyzine as needed for itching CBC, CMP Return precautions given. Final Clinical Impressions(s) / UC Diagnoses   Final diagnoses:  Tinea corporis     Discharge Instructions      Please use medications as prescribed Use mild soap and detergent for bathing and laundering clothes respectively If symptoms  persist after medications are exhausted please return to urgent care to be reevaluated. Will call you  with recommendations if labs are abnormal.   ED Prescriptions     Medication Sig Dispense Auth. Provider   terbinafine (LAMISIL) 250 MG tablet Take 1 tablet (250 mg total) by mouth daily. 10 tablet Aslin Farinas, Britta Mccreedy, MD   hydrOXYzine (ATARAX) 25 MG tablet Take 1 tablet (25 mg total) by mouth every 8 (eight) hours as needed. 30 tablet Vianny Schraeder, Britta Mccreedy, MD   triamcinolone cream (KENALOG) 0.1 % Apply 1 Application topically 2 (two) times daily. 30 g Daleyza Gadomski, Britta Mccreedy, MD      PDMP not reviewed this encounter.   Merrilee Jansky, MD 07/02/22 [phone]

## 2022-07-19 ENCOUNTER — Encounter: Payer: Self-pay | Admitting: Gastroenterology

## 2022-07-26 ENCOUNTER — Ambulatory Visit (AMBULATORY_SURGERY_CENTER): Payer: Medicaid Other | Admitting: Gastroenterology

## 2022-07-26 ENCOUNTER — Encounter: Payer: Self-pay | Admitting: Gastroenterology

## 2022-07-26 VITALS — BP 167/79 | HR 56 | Temp 97.8°F | Resp 12 | Ht 60.0 in | Wt 96.0 lb

## 2022-07-26 DIAGNOSIS — K5900 Constipation, unspecified: Secondary | ICD-10-CM

## 2022-07-26 DIAGNOSIS — K625 Hemorrhage of anus and rectum: Secondary | ICD-10-CM

## 2022-07-26 MED ORDER — SODIUM CHLORIDE 0.9 % IV SOLN: 500.0000 mL | Freq: Once | INTRAVENOUS | Status: DC

## 2022-07-26 NOTE — Patient Instructions (Signed)

## 2022-07-26 NOTE — Progress Notes (Signed)
See 06/30/2022 H&P, no changes

## 2022-07-26 NOTE — Op Note (Signed)
[facility] Patient Name: Laura Harris Procedure Date: 07/26/2022 10:00 AM MRN: [id number] Endoscopist: Meryl Dare , MD Age: 66 Referring MD:  Date of Birth: 1956/03/12 Gender: Female Account #: [id number] Procedure:                Colonoscopy Indications:              Rectal bleeding, Constipation Medicines:                Monitored Anesthesia Care Procedure:                Pre-Anesthesia Assessment:                           - Prior to the procedure, a History and Physical                            was performed, and patient medications and                            allergies were reviewed. The patient's tolerance of                            previous anesthesia was also reviewed. The risks                            and benefits of the procedure and the sedation                            options and risks were discussed with the patient.                            All questions were answered, and informed consent                            was obtained. Prior Anticoagulants: The patient has                            taken no previous anticoagulant or antiplatelet                            agents. ASA Grade Assessment: II - A patient with                            mild systemic disease. After reviewing the risks                            and benefits, the patient was deemed in                            satisfactory condition to undergo the procedure.                           After obtaining informed consent, the colonoscope  was passed under direct vision. Throughout the                            procedure, the patient's blood pressure, pulse, and                            oxygen saturations were monitored continuously. The                            Olympus PCF-H190DL ([id number]) Colonoscope was                            introduced through the anus and advanced to the the                            cecum, identified by appendiceal  orifice and                            ileocecal valve. The ileocecal valve, appendiceal                            orifice, and rectum were photographed. The quality                            of the bowel preparation was excellent. The                            colonoscopy was performed without difficulty. The                            patient tolerated the procedure well. Scope In: 10:06:21 AM Scope Out: 10:16:57 AM Scope Withdrawal Time: 0 hours 7 minutes 53 seconds  Total Procedure Duration: 0 hours 10 minutes 36 seconds  Findings:                 The perianal and digital rectal examinations were                            normal.                           Internal hemorrhoids were found during                            retroflexion. The hemorrhoids were small and Grade                            I (internal hemorrhoids that do not prolapse).                           The exam was otherwise without abnormality on                            direct and retroflexion views. Complications:            No immediate complications.  Estimated blood loss:                            None. Estimated Blood Loss:     Estimated blood loss: none. Impression:               - Internal hemorrhoids.                           - The examination was otherwise normal on direct                            and retroflexion views.                           - No specimens collected. Recommendation:           - Repeat colonoscopy in 10 years for screening                            purposes.                           - Patient has a contact number available for                            emergencies. The signs and symptoms of potential                            delayed complications were discussed with the                            patient. Return to normal activities tomorrow.                            Written discharge instructions were provided to the                            patient.                            - Resume previous diet.                           - Continue present medications. Ladene Artist, MD 07/26/2022 10:25:52 AM This report has been signed electronically.

## 2022-07-26 NOTE — Progress Notes (Signed)
A and O x3. Report to RN. Tolerated MAC anesthesia well. 

## 2022-07-27 ENCOUNTER — Telehealth: Payer: Self-pay

## 2022-07-27 NOTE — Telephone Encounter (Signed)
  Follow up Call-     07/26/2022    9:16 AM  Call back number  Post procedure Call Back phone  # Wynelle Link (daughter) [phone]  Permission to leave phone message Yes   Follow up call, LVM with daughter "Wynelle Link".

## 2022-08-15 ENCOUNTER — Ambulatory Visit
Admission: RE | Admit: 2022-08-15 | Discharge: 2022-08-15 | Disposition: A | Discharge status: Home / Self Care | Payer: Medicaid Other | Source: Ambulatory Visit | Attending: Internal Medicine | Admitting: Internal Medicine

## 2022-08-15 DIAGNOSIS — Z780 Asymptomatic menopausal state: Secondary | ICD-10-CM

## 2022-08-15 DIAGNOSIS — Z1231 Encounter for screening mammogram for malignant neoplasm of breast: Secondary | ICD-10-CM

## 2022-12-09 ENCOUNTER — Encounter: Payer: Self-pay | Admitting: Internal Medicine

## 2022-12-09 ENCOUNTER — Ambulatory Visit: Payer: Medicaid Other | Admitting: Internal Medicine

## 2022-12-09 VITALS — BP 130/80 | HR 68 | Resp 16 | Ht 60.0 in | Wt 104.0 lb

## 2022-12-09 DIAGNOSIS — L230 Allergic contact dermatitis due to metals: Secondary | ICD-10-CM

## 2022-12-09 DIAGNOSIS — D649 Anemia, unspecified: Secondary | ICD-10-CM

## 2022-12-09 DIAGNOSIS — L309 Dermatitis, unspecified: Secondary | ICD-10-CM | POA: Diagnosis not present

## 2022-12-09 DIAGNOSIS — M810 Age-related osteoporosis without current pathological fracture: Secondary | ICD-10-CM | POA: Diagnosis not present

## 2022-12-09 MED ORDER — LOSARTAN POTASSIUM 100 MG PO TABS: 100.0000 mg | ORAL_TABLET | Freq: Every day | ORAL | 3 refills | Status: DC

## 2022-12-09 MED ORDER — ALENDRONATE SODIUM 70 MG PO TABS: 70.0000 mg | ORAL_TABLET | ORAL | 11 refills | Status: DC

## 2022-12-09 MED ORDER — FLUOCINONIDE 0.05 % EX SOLN
CUTANEOUS | 2 refills | Status: DC
Start: 2022-12-09 — End: 2023-09-12

## 2022-12-09 MED ORDER — TRIAMCINOLONE ACETONIDE 0.1 % EX CREA: TOPICAL_CREAM | CUTANEOUS | 2 refills | Status: DC

## 2022-12-09 NOTE — Patient Instructions (Addendum)
Dove Soap for washing skin and hydrating shampoo Do not wear a necklace for 1 month.  After taking Alendronate once weekly for thin bones, please sit up for 2 hours and do not eat or take any other medications for 1 hour after taking Alendronate

## 2022-12-09 NOTE — Progress Notes (Signed)
    Subjective:    Patient ID: Laura Harris, female   DOB: 27-Nov-1955, 67 y.o.   MRN: [id number]   HPI  Son, OT, interprets Mike Gip   Significant osteoporosis with T score at left radius of  -3/1 Right femoral neck at -2.6 Vertebrae at -4.1  She does walk on treadmill for 1 hour daily. Drinks some milk No vitamin D level available   2.  Itching on back of neck and into hairline at nape of neck.  Not sure what soap she uses.  Also, not aware of shampoo. Current Meds  Medication Sig   amLODipine (NORVASC) 10 MG tablet 1 tab by mouth daily with morning meal   methylcellulose (CITRUCEL) oral powder Take 1 packet by mouth daily.   metoprolol succinate (TOPROL-XL) 25 MG 24 hr tablet 1 tab by mouth daily with evening meal.   No Known Allergies   Review of Systems    Objective:   BP 130/80 (BP Location: Left Arm, Patient Position: Sitting, Cuff Size: Normal)   Pulse 68   Resp 16   Ht 5' (1.524 m)   Wt 104 lb (47.2 kg)   BMI 20.31 kg/m   Physical Exam NAD HEENT:  Thick patches of white dry flaky scalp within hairline at nape of neck/low occipital level.  Also with patches around necklace area of posterior and bilateral neck with dryness, mild inflammation, minor fissuring of skin.     Assessment & Plan    Osteoporosis, vertebrae more profoundly affected.  Checking Vitamin D level before recommending Calcium and Vitamin D supplementation.   Start Alendronate 70 mg once weekly.  Discussed side effects and taking on empty stomach with 8 oz of water only.  No other oral intake or meds for 1 hour and to sit up for 2 hours after.  Call for acid reflux sx.    2.  History of eczema and with what appears to be contact derm in necklace area and patches at nape of neck:  Fluocinonide solution twice daily as needed to control scalp patches.  Triamcinolone cream for neck area.  Stop wearing necklace for 1 month as not clear if gold or gold plated nickel chain.  Dove soap and hydrating  shampoo.  3,  HM:  encouraged influenza and COVID boosters at pharmacy.

## 2022-12-10 LAB — VITAMIN D 25 HYDROXY (VIT D DEFICIENCY, FRACTURES): Vit D, 25-Hydroxy: 22.4 ng/mL — ABNORMAL LOW (ref 30.0–100.0)

## 2022-12-12 MED ORDER — VITAMIN D3 25 MCG (1000 UT) PO CAPS: 1000.0000 [IU] | ORAL_CAPSULE | Freq: Every day | ORAL | 1 refills | Status: AC

## 2022-12-12 MED ORDER — CALCIUM CITRATE 250 MG PO TABS: 2.0000 | ORAL_TABLET | Freq: Two times a day (BID) | ORAL | 0 refills | Status: AC

## 2022-12-12 NOTE — Addendum Note (Signed)
Addended by: Marcelino Duster on: 12/12/2022 04:26 PM   Modules accepted: Orders

## 2023-04-25 ENCOUNTER — Other Ambulatory Visit: Payer: Self-pay | Admitting: Internal Medicine

## 2023-04-26 ENCOUNTER — Other Ambulatory Visit: Payer: Self-pay

## 2023-05-14 ENCOUNTER — Other Ambulatory Visit: Payer: Self-pay | Admitting: Internal Medicine

## 2023-09-12 ENCOUNTER — Ambulatory Visit: Payer: Medicaid Other | Admitting: Internal Medicine

## 2023-09-12 ENCOUNTER — Encounter: Payer: Self-pay | Admitting: Internal Medicine

## 2023-09-12 VITALS — BP 120/70 | HR 64 | Resp 12 | Ht 60.0 in | Wt 103.0 lb

## 2023-09-12 DIAGNOSIS — D649 Anemia, unspecified: Secondary | ICD-10-CM

## 2023-09-12 DIAGNOSIS — M810 Age-related osteoporosis without current pathological fracture: Secondary | ICD-10-CM | POA: Diagnosis not present

## 2023-09-12 DIAGNOSIS — L309 Dermatitis, unspecified: Secondary | ICD-10-CM

## 2023-09-12 DIAGNOSIS — E7800 Pure hypercholesterolemia, unspecified: Secondary | ICD-10-CM | POA: Diagnosis not present

## 2023-09-12 DIAGNOSIS — I10 Essential (primary) hypertension: Secondary | ICD-10-CM

## 2023-09-12 DIAGNOSIS — Z23 Encounter for immunization: Secondary | ICD-10-CM | POA: Diagnosis not present

## 2023-09-12 DIAGNOSIS — N189 Chronic kidney disease, unspecified: Secondary | ICD-10-CM

## 2023-09-12 MED ORDER — METOPROLOL SUCCINATE ER 25 MG PO TB24: ORAL_TABLET | ORAL | 3 refills | Status: DC

## 2023-09-12 MED ORDER — LOSARTAN POTASSIUM 100 MG PO TABS: 100.0000 mg | ORAL_TABLET | Freq: Every day | ORAL | 3 refills | Status: DC

## 2023-09-12 MED ORDER — AMLODIPINE BESYLATE 10 MG PO TABS: ORAL_TABLET | ORAL | 3 refills | Status: DC

## 2023-09-12 MED ORDER — FLUOCINONIDE 0.05 % EX SOLN: CUTANEOUS | 2 refills | Status: AC

## 2023-09-12 MED ORDER — ALENDRONATE SODIUM 70 MG PO TABS: 70.0000 mg | ORAL_TABLET | ORAL | 3 refills | Status: DC

## 2023-09-12 NOTE — Progress Notes (Signed)
Subjective:    Patient ID: Laura Harris, female   DOB: 1956-07-25, 67 y.o.   MRN: [id number]   HPI  Son interprets, Laura Harris   Osteoporosis:  she is not taking either the Vitamin D3, which was low still in January, nor calcium.  She continues on the Alendronate  She is not having any side effects with the med.  Discussed importance of the D and Calcium to help build bones.    2.  Hypertension: well controlled, History of mild CKD, likely from previous history of poorly controlled BP.    Current Meds  Medication Sig   alendronate (FOSAMAX) 70 MG tablet Take 1 tablet (70 mg total) by mouth every 7 (seven) days. Take with a full glass of water on an empty stomach.   amLODipine (NORVASC) 10 MG tablet TAKE 1 TABLET BY MOUTH ONCE DAILY WITH MORNING MEAL   losartan (COZAAR) 100 MG tablet TAKE 1 TABLET BY MOUTH ONCE DAILY WITH MORNING MEAL   metoprolol succinate (TOPROL-XL) 25 MG 24 hr tablet TAKE 1 TABLET BY MOUTH ONCE DAILY WITH EVENING MEAL   triamcinolone cream (KENALOG) 0.1 % Apply to affected area twice daily as needed.   No Known Allergies   Review of Systems    Objective:   BP 120/70 (BP Location: Left Arm, Patient Position: Sitting, Cuff Size: Normal)   Pulse 64   Resp 12   Ht 5' (1.524 m)   Wt 103 lb (46.7 kg)   BMI 20.12 kg/m   Physical Exam NAD HEENT:  PERRL, EOMI.  Scalp with heavy flaking at high nape Neck:  Supple, No adenopathy Chest:  CTA CV:  RRR with normal S1 and S2, No S3, S4 or murmur.  No carotid bruits.  Carotid, radial and DP pulses normal and equal Abd:  S, NT, NO HSM or mass, + BS LE:  No edema.    Assessment & Plan   Osteoporosis with low Vitamin D.  Needs to take Vitamin D daily and Calcium citrate twice daily.  Will readdress D level when back beginning of next year.  2.  Hypertension:  controlled.  CMP later in week.  3.  Eczema vs seborrhea of scalp:  Fluocinonide solution for scalp twice daily.  4.  Hypercholesterolemia:  FLP later in week  with rest of labs.  5.  Hyperglycemia:  glucose fasting and A1C later in week.  6.  Normocytic anemia:  CBC later in week.  7.  HM:  influenza, regular dose as out of Fluad and patient leaving on Sunday for 3-4 months.  Spikevax as well.   When in for labs, Tdap and Pneuococcal 20.

## 2023-09-13 ENCOUNTER — Other Ambulatory Visit (INDEPENDENT_AMBULATORY_CARE_PROVIDER_SITE_OTHER): Payer: Medicaid Other

## 2023-09-13 DIAGNOSIS — D649 Anemia, unspecified: Secondary | ICD-10-CM | POA: Diagnosis not present

## 2023-09-13 DIAGNOSIS — N189 Chronic kidney disease, unspecified: Secondary | ICD-10-CM | POA: Diagnosis not present

## 2023-09-13 DIAGNOSIS — E7800 Pure hypercholesterolemia, unspecified: Secondary | ICD-10-CM

## 2023-09-13 DIAGNOSIS — E041 Nontoxic single thyroid nodule: Secondary | ICD-10-CM

## 2023-09-13 DIAGNOSIS — R739 Hyperglycemia, unspecified: Secondary | ICD-10-CM | POA: Diagnosis not present

## 2023-09-14 LAB — CBC WITH DIFFERENTIAL/PLATELET
Basophils Absolute: 0 10*3/uL (ref 0.0–0.2)
Basos: 0 %
EOS (ABSOLUTE): 0 10*3/uL (ref 0.0–0.4)
Eos: 1 %
Hematocrit: 33.7 % — ABNORMAL LOW (ref 34.0–46.6)
Hemoglobin: 10.3 g/dL — ABNORMAL LOW (ref 11.1–15.9)
Immature Grans (Abs): 0 10*3/uL (ref 0.0–0.1)
Immature Granulocytes: 0 %
Lymphocytes Absolute: 0.4 10*3/uL — ABNORMAL LOW (ref 0.7–3.1)
Lymphs: 9 %
MCH: 25.2 pg — ABNORMAL LOW (ref 26.6–33.0)
MCHC: 30.6 g/dL — ABNORMAL LOW (ref 31.5–35.7)
MCV: 82 fL (ref 79–97)
Monocytes Absolute: 0.3 10*3/uL (ref 0.1–0.9)
Monocytes: 5 %
Neutrophils Absolute: 4.1 10*3/uL (ref 1.4–7.0)
Neutrophils: 85 %
Platelets: 159 10*3/uL (ref 150–450)
RBC: 4.09 x10E6/uL (ref 3.77–5.28)
RDW: 12.7 % (ref 11.7–15.4)
WBC: 4.8 10*3/uL (ref 3.4–10.8)

## 2023-09-14 LAB — HEMOGLOBIN A1C
Est. average glucose Bld gHb Est-mCnc: 120 mg/dL
Hgb A1c MFr Bld: 5.8 % — ABNORMAL HIGH (ref 4.8–5.6)

## 2023-09-14 LAB — COMPREHENSIVE METABOLIC PANEL
ALT: 14 [IU]/L (ref 0–32)
AST: 22 [IU]/L (ref 0–40)
Albumin: 4.3 g/dL (ref 3.9–4.9)
Alkaline Phosphatase: 52 [IU]/L (ref 44–121)
BUN/Creatinine Ratio: 19 (ref 12–28)
BUN: 19 mg/dL (ref 8–27)
Bilirubin Total: 0.6 mg/dL (ref 0.0–1.2)
CO2: 22 mmol/L (ref 20–29)
Calcium: 8.6 mg/dL — ABNORMAL LOW (ref 8.7–10.3)
Chloride: 102 mmol/L (ref 96–106)
Creatinine, Ser: 1.01 mg/dL — ABNORMAL HIGH (ref 0.57–1.00)
Globulin, Total: 2.9 g/dL (ref 1.5–4.5)
Glucose: 88 mg/dL (ref 70–99)
Potassium: 4.2 mmol/L (ref 3.5–5.2)
Sodium: 137 mmol/L (ref 134–144)
Total Protein: 7.2 g/dL (ref 6.0–8.5)
eGFR: 62 mL/min/{1.73_m2} (ref >59)

## 2023-09-14 LAB — TSH: TSH: 0.548 u[IU]/mL (ref 0.450–4.500)

## 2023-09-14 LAB — LIPID PANEL W/O CHOL/HDL RATIO
Cholesterol, Total: 228 mg/dL — ABNORMAL HIGH (ref 100–199)
HDL: 75 mg/dL (ref >39)
LDL Chol Calc (NIH): 141 mg/dL — ABNORMAL HIGH (ref 0–99)
Triglycerides: 69 mg/dL (ref 0–149)
VLDL Cholesterol Cal: 12 mg/dL (ref 5–40)

## 2023-11-22 DIAGNOSIS — D563 Thalassemia minor: Secondary | ICD-10-CM | POA: Insufficient documentation

## 2023-11-22 HISTORY — DX: Thalassemia minor: D56.3

## 2023-12-04 ENCOUNTER — Other Ambulatory Visit: Payer: Medicaid Other

## 2024-01-02 ENCOUNTER — Observation Stay (HOSPITAL_COMMUNITY)
Admission: EM | Admit: 2024-01-02 | Discharge: 2024-01-04 | Disposition: A | Discharge status: Home / Self Care | Payer: Medicaid Other | Attending: Internal Medicine | Admitting: Internal Medicine

## 2024-01-02 ENCOUNTER — Other Ambulatory Visit: Payer: Self-pay

## 2024-01-02 DIAGNOSIS — R739 Hyperglycemia, unspecified: Secondary | ICD-10-CM | POA: Insufficient documentation

## 2024-01-02 DIAGNOSIS — R7303 Prediabetes: Secondary | ICD-10-CM | POA: Insufficient documentation

## 2024-01-02 DIAGNOSIS — R1010 Upper abdominal pain, unspecified: Secondary | ICD-10-CM | POA: Insufficient documentation

## 2024-01-02 DIAGNOSIS — N189 Chronic kidney disease, unspecified: Secondary | ICD-10-CM | POA: Diagnosis not present

## 2024-01-02 DIAGNOSIS — D631 Anemia in chronic kidney disease: Secondary | ICD-10-CM | POA: Diagnosis not present

## 2024-01-02 DIAGNOSIS — I129 Hypertensive chronic kidney disease with stage 1 through stage 4 chronic kidney disease, or unspecified chronic kidney disease: Secondary | ICD-10-CM | POA: Diagnosis not present

## 2024-01-02 DIAGNOSIS — N179 Acute kidney failure, unspecified: Principal | ICD-10-CM | POA: Diagnosis present

## 2024-01-02 DIAGNOSIS — E860 Dehydration: Secondary | ICD-10-CM | POA: Insufficient documentation

## 2024-01-02 DIAGNOSIS — K529 Noninfective gastroenteritis and colitis, unspecified: Secondary | ICD-10-CM | POA: Insufficient documentation

## 2024-01-02 DIAGNOSIS — E785 Hyperlipidemia, unspecified: Secondary | ICD-10-CM | POA: Insufficient documentation

## 2024-01-02 DIAGNOSIS — R1084 Generalized abdominal pain: Secondary | ICD-10-CM | POA: Diagnosis present

## 2024-01-02 DIAGNOSIS — D649 Anemia, unspecified: Secondary | ICD-10-CM

## 2024-01-02 LAB — CBC WITH DIFFERENTIAL/PLATELET
Abs Immature Granulocytes: 0.03 10*3/uL (ref 0.00–0.07)
Basophils Absolute: 0 10*3/uL (ref 0.0–0.1)
Basophils Relative: 0 %
Eosinophils Absolute: 0.1 10*3/uL (ref 0.0–0.5)
Eosinophils Relative: 1 %
HCT: 31.6 % — ABNORMAL LOW (ref 36.0–46.0)
Hemoglobin: 10.2 g/dL — ABNORMAL LOW (ref 12.0–15.0)
Immature Granulocytes: 0 %
Lymphocytes Relative: 12 %
Lymphs Abs: 0.9 10*3/uL (ref 0.7–4.0)
MCH: 26.4 pg (ref 26.0–34.0)
MCHC: 32.3 g/dL (ref 30.0–36.0)
MCV: 81.9 fL (ref 80.0–100.0)
Monocytes Absolute: 0.5 10*3/uL (ref 0.1–1.0)
Monocytes Relative: 7 %
Neutro Abs: 5.6 10*3/uL (ref 1.7–7.7)
Neutrophils Relative %: 80 %
Platelets: 164 10*3/uL (ref 150–400)
RBC: 3.86 MIL/uL — ABNORMAL LOW (ref 3.87–5.11)
RDW: 13.3 % (ref 11.5–15.5)
WBC: 7 10*3/uL (ref 4.0–10.5)
nRBC: 0 % (ref 0.0–0.2)

## 2024-01-02 LAB — URINALYSIS, ROUTINE W REFLEX MICROSCOPIC
Bacteria, UA: NONE SEEN
Bilirubin Urine: NEGATIVE
Glucose, UA: NEGATIVE mg/dL
Hgb urine dipstick: NEGATIVE
Ketones, ur: NEGATIVE mg/dL
Nitrite: NEGATIVE
Protein, ur: NEGATIVE mg/dL
Specific Gravity, Urine: 1.017 (ref 1.005–1.030)
pH: 5 (ref 5.0–8.0)

## 2024-01-02 LAB — COMPREHENSIVE METABOLIC PANEL
ALT: 15 U/L (ref 0–44)
AST: 21 U/L (ref 15–41)
Albumin: 3.5 g/dL (ref 3.5–5.0)
Alkaline Phosphatase: 36 U/L — ABNORMAL LOW (ref 38–126)
Anion gap: 12 (ref 5–15)
BUN: 30 mg/dL — ABNORMAL HIGH (ref 8–23)
CO2: 23 mmol/L (ref 22–32)
Calcium: 8.5 mg/dL — ABNORMAL LOW (ref 8.9–10.3)
Chloride: 101 mmol/L (ref 98–111)
Creatinine, Ser: 2.15 mg/dL — ABNORMAL HIGH (ref 0.44–1.00)
GFR, Estimated: 25 mL/min — ABNORMAL LOW (ref >60)
Glucose, Bld: 136 mg/dL — ABNORMAL HIGH (ref 70–99)
Potassium: 4.3 mmol/L (ref 3.5–5.1)
Sodium: 136 mmol/L (ref 135–145)
Total Bilirubin: 1.1 mg/dL (ref 0.0–1.2)
Total Protein: 6.6 g/dL (ref 6.5–8.1)

## 2024-01-02 LAB — LIPASE, BLOOD: Lipase: 40 U/L (ref 11–51)

## 2024-01-02 NOTE — ED Triage Notes (Addendum)
Pt to ED c/o abdominal pain x 2 hours. Denies N/V. Denies chest pain. Reports near syncopal episode earlier while in bathroom. Pt orientations now back to baseline.  Pt son to bedside to translate for pt.

## 2024-01-03 ENCOUNTER — Observation Stay (HOSPITAL_COMMUNITY): Payer: Medicaid Other

## 2024-01-03 ENCOUNTER — Encounter (HOSPITAL_COMMUNITY): Payer: Self-pay | Admitting: Internal Medicine

## 2024-01-03 ENCOUNTER — Emergency Department (HOSPITAL_COMMUNITY): Payer: Medicaid Other

## 2024-01-03 DIAGNOSIS — N179 Acute kidney failure, unspecified: Secondary | ICD-10-CM | POA: Diagnosis present

## 2024-01-03 LAB — IRON AND TIBC
Iron: 31 ug/dL (ref 28–170)
Saturation Ratios: 13 % (ref 10.4–31.8)
TIBC: 235 ug/dL — ABNORMAL LOW (ref 250–450)
UIBC: 204 ug/dL

## 2024-01-03 LAB — COMPREHENSIVE METABOLIC PANEL
ALT: 12 U/L (ref 0–44)
AST: 19 U/L (ref 15–41)
Albumin: 3.1 g/dL — ABNORMAL LOW (ref 3.5–5.0)
Alkaline Phosphatase: 30 U/L — ABNORMAL LOW (ref 38–126)
Anion gap: 8 (ref 5–15)
BUN: 26 mg/dL — ABNORMAL HIGH (ref 8–23)
CO2: 19 mmol/L — ABNORMAL LOW (ref 22–32)
Calcium: 7.6 mg/dL — ABNORMAL LOW (ref 8.9–10.3)
Chloride: 109 mmol/L (ref 98–111)
Creatinine, Ser: 1.55 mg/dL — ABNORMAL HIGH (ref 0.44–1.00)
GFR, Estimated: 37 mL/min — ABNORMAL LOW (ref >60)
Glucose, Bld: 85 mg/dL (ref 70–99)
Potassium: 4.3 mmol/L (ref 3.5–5.1)
Sodium: 136 mmol/L (ref 135–145)
Total Bilirubin: 0.9 mg/dL (ref 0.0–1.2)
Total Protein: 5.8 g/dL — ABNORMAL LOW (ref 6.5–8.1)

## 2024-01-03 LAB — CBC WITH DIFFERENTIAL/PLATELET
Abs Immature Granulocytes: 0.01 10*3/uL (ref 0.00–0.07)
Basophils Absolute: 0 10*3/uL (ref 0.0–0.1)
Basophils Relative: 0 %
Eosinophils Absolute: 0 10*3/uL (ref 0.0–0.5)
Eosinophils Relative: 1 %
HCT: 29.6 % — ABNORMAL LOW (ref 36.0–46.0)
Hemoglobin: 9.2 g/dL — ABNORMAL LOW (ref 12.0–15.0)
Immature Granulocytes: 0 %
Lymphocytes Relative: 18 %
Lymphs Abs: 0.9 10*3/uL (ref 0.7–4.0)
MCH: 26.1 pg (ref 26.0–34.0)
MCHC: 31.1 g/dL (ref 30.0–36.0)
MCV: 83.9 fL (ref 80.0–100.0)
Monocytes Absolute: 0.6 10*3/uL (ref 0.1–1.0)
Monocytes Relative: 12 %
Neutro Abs: 3.3 10*3/uL (ref 1.7–7.7)
Neutrophils Relative %: 69 %
Platelets: 116 10*3/uL — ABNORMAL LOW (ref 150–400)
RBC: 3.53 MIL/uL — ABNORMAL LOW (ref 3.87–5.11)
RDW: 13.5 % (ref 11.5–15.5)
WBC: 4.8 10*3/uL (ref 4.0–10.5)
nRBC: 0 % (ref 0.0–0.2)

## 2024-01-03 LAB — SODIUM, URINE, RANDOM: Sodium, Ur: 70 mmol/L

## 2024-01-03 LAB — HEMOGLOBIN AND HEMATOCRIT, BLOOD
HCT: 29.6 % — ABNORMAL LOW (ref 36.0–46.0)
Hemoglobin: 9.3 g/dL — ABNORMAL LOW (ref 12.0–15.0)

## 2024-01-03 LAB — FERRITIN: Ferritin: 111 ng/mL (ref 11–307)

## 2024-01-03 LAB — CREATININE, URINE, RANDOM: Creatinine, Urine: 20 mg/dL

## 2024-01-03 LAB — MAGNESIUM: Magnesium: 1.8 mg/dL (ref 1.7–2.4)

## 2024-01-03 MED ORDER — LACTATED RINGERS IV SOLN: INTRAVENOUS | Status: AC

## 2024-01-03 MED ORDER — MELATONIN 3 MG PO TABS: 3.0000 mg | ORAL_TABLET | Freq: Every evening | ORAL | Status: DC | PRN

## 2024-01-03 MED ORDER — FENTANYL CITRATE PF 50 MCG/ML IJ SOSY: 25.0000 ug | PREFILLED_SYRINGE | INTRAMUSCULAR | Status: DC | PRN

## 2024-01-03 MED ORDER — ORAL CARE MOUTH RINSE: 15.0000 mL | OROMUCOSAL | Status: DC | PRN

## 2024-01-03 MED ORDER — SODIUM CHLORIDE 0.9 % IV BOLUS
1000.0000 mL | Freq: Once | INTRAVENOUS | Status: AC
  Administered 2024-01-03: 1000 mL via INTRAVENOUS

## 2024-01-03 MED ORDER — ENSURE ENLIVE PO LIQD
237.0000 mL | Freq: Two times a day (BID) | ORAL | Status: DC
  Administered 2024-01-04: 237 mL via ORAL

## 2024-01-03 MED ORDER — ACETAMINOPHEN 650 MG RE SUPP: 650.0000 mg | Freq: Four times a day (QID) | RECTAL | Status: DC | PRN

## 2024-01-03 MED ORDER — ONDANSETRON HCL 4 MG/2ML IJ SOLN: 4.0000 mg | Freq: Four times a day (QID) | INTRAMUSCULAR | Status: DC | PRN

## 2024-01-03 MED ORDER — LACTATED RINGERS IV SOLN: INTRAVENOUS | Status: DC

## 2024-01-03 MED ORDER — NALOXONE HCL 0.4 MG/ML IJ SOLN: 0.4000 mg | INTRAMUSCULAR | Status: DC | PRN

## 2024-01-03 MED ORDER — ACETAMINOPHEN 325 MG PO TABS: 650.0000 mg | ORAL_TABLET | Freq: Four times a day (QID) | ORAL | Status: DC | PRN

## 2024-01-03 MED ORDER — MORPHINE SULFATE (PF) 4 MG/ML IV SOLN
4.0000 mg | Freq: Once | INTRAVENOUS | Status: AC
  Administered 2024-01-03: 4 mg via INTRAVENOUS
  Filled 2024-01-03: qty 1

## 2024-01-03 NOTE — Plan of Care (Signed)

## 2024-01-03 NOTE — Progress Notes (Signed)
  Carryover admission to the Day Admitter.  I discussed this case with the EDP, Dr. Preston Fleeting.  Per these discussions:   This is a 68 year old female who speaks Montagn[place], who is being admitted with acute kidney injury after presenting with 1 to 2 days of new onset abdominal discomfort after recently returning from [place] where she was visiting family.  Her abdominal pain is not associated that he fever, or any nausea, vomiting, diarrhea.  Relative to her reported baseline creatinine of around 1.0, her creatinine this evening is found to be 2.15.  Urinalysis with microscopy has been ordered.  CT abdomen/pelvis is reported to show no evidence of acute intra-abdominal or acute intrapelvic process, including no evidence of postrenal obstruction.  Her outpatient medications include losartan.  Per my discussions with EDP, in 2017 there was a transient increase in the patient's creatinine to 1.75.  However, there is no corresponding documentation regarding etiology of AKI, or any intervention that led to the ensuing improvement in her creatinine back to its baseline level of around 1.0.  I have placed an order for observation to med/tele for further evaluation management of the above.  I have placed some additional preliminary admit orders via the adult multi-morbid admission order set. I have also ordered random urine creatinine, random urine sodium, continuous lactated Ringer's at 75 cc/h x 12 hours.  Also ordered CMP, CBC, magnesium level for the morning as well as prn IV fentanyl for residual abdominal discomfort.    Newton Pigg, DO Hospitalist

## 2024-01-03 NOTE — H&P (Addendum)
History and Physical    Patient: Laura Harris NWG:[id number] DOB: 10-23-1956 DOA: 01/02/2024 DOS: the patient was seen and examined on 01/03/2024 PCP: Julieanne Manson, MD  Patient coming from: Home  Chief Complaint:  Chief Complaint  Patient presents with   Abdominal Pain   Near Syncope        HPI: Laura Harris is a 68 y.o. female with medical history significant of hypertension, chronic anemia, prediabetes who presented to the ED with new onset abdominal discomfort for the past 2 days.  Patient and daughter at bedside.  States that patient recently returned from a trip to [place] where she was visiting family.  She was there for about 3 months and returned this past Sunday.  Patient states that for the past couple of days, she has been noting generalized abdominal discomfort though unable to elaborate further.  She has been eating and drinking, though does report decreasing appetite over this time.  She does state that she may not have been drinking enough water during her trip in [place].  She denies any fevers, chills, nausea, vomiting, chest pain, shortness of breath, diarrhea, constipation.  She denies any recent sick contacts.  She denies any melena or hematochezia.  She does note that her abdominal pain has improved since arrival to the ED.  ED course: Vital signs stable.  CBC without leukocytosis, chronic anemia at baseline.  CMP with mild hyperglycemia to 136, creatinine 2.15 (baseline appears to be around 1).  Lipase normal.  UA without infection.  CT abdomen pelvis without contrast did not reveal any acute findings in the abdomen or pelvis.  [facility] hospitalist asked to evaluate patient for admission for AKI.   Review of Systems: As mentioned in the history of present illness. All other systems reviewed and are negative. Past Medical History:  Diagnosis Date   Bilateral knee pain    CKD (chronic kidney disease) 2017   Hyperlipidemia 10/18/2020   Hypertension    Past Surgical History:   Procedure Laterality Date   NO PAST SURGERIES     Social History:  reports that she has never smoked. She has never used smokeless tobacco. She reports that she does not drink alcohol and does not use drugs.  No Known Allergies  Family History  Problem Relation Age of Onset   GER disease Mother    Migraines Father    Thyroid disease Sister        describe a very large goiter   Migraines Brother    Hypertension Brother    Kidney disease Brother    Mental illness Brother        He disappeared--do not know if alive   Diabetes Daughter    Anemia Daughter     Prior to Admission medications   Medication Sig Start Date End Date Taking? Authorizing Provider  acetaminophen (TYLENOL) 325 MG tablet Take 650 mg by mouth every 6 (six) hours as needed for headache or mild pain (pain score 1-3).   Yes [provider]  alendronate (FOSAMAX) 70 MG tablet Take 1 tablet (70 mg total) by mouth every 7 (seven) days. Take with a full glass of water on an empty stomach. 09/12/23  Yes Julieanne Manson, MD  amLODipine (NORVASC) 10 MG tablet TAKE 1 TABLET BY MOUTH ONCE DAILY WITH MORNING MEAL 09/12/23  Yes Julieanne Manson, MD  Calcium Citrate 250 MG TABS Take 2 tablets (500 mg total) by mouth 2 (two) times daily. 12/12/22  Yes Julieanne Manson, MD  Cholecalciferol (VITAMIN D3)  25 MCG (1000 UT) CAPS Take 1 capsule (1,000 Units total) by mouth daily. 12/12/22  Yes Julieanne Manson, MD  fluocinonide (LIDEX) 0.05 % external solution Apply to affected areas of scalp twice daily as needed. 09/12/23  Yes Julieanne Manson, MD  losartan (COZAAR) 100 MG tablet Take 1 tablet (100 mg total) by mouth daily. 09/12/23  Yes Julieanne Manson, MD  metoprolol succinate (TOPROL-XL) 25 MG 24 hr tablet TAKE 1 TABLET BY MOUTH ONCE DAILY WITH EVENING MEAL 09/12/23  Yes Julieanne Manson, MD  triamcinolone cream (KENALOG) 0.1 % Apply to affected area twice daily as needed. 12/09/22  Yes Julieanne Manson, MD    Physical Exam: Vitals:   01/03/24 0500 01/03/24 0538 01/03/24 0628 01/03/24 0629  BP: 136/70  136/67   Pulse: 71  67 62  Resp: 17   12  Temp:  97.9 F (36.6 C)    TempSrc:  Oral    SpO2: 100%  100% 100%   Physical Exam Constitutional:      Appearance: She is not ill-appearing.     Comments: thin  HENT:     Head: Normocephalic and atraumatic.     Mouth/Throat:     Mouth: Mucous membranes are dry.     Pharynx: Oropharynx is clear. No oropharyngeal exudate.  Eyes:     General: No scleral icterus.    Extraocular Movements: Extraocular movements intact.     Conjunctiva/sclera: Conjunctivae normal.     Pupils: Pupils are equal, round, and reactive to light.  Cardiovascular:     Rate and Rhythm: Normal rate and regular rhythm.     Pulses: Normal pulses.     Heart sounds: Normal heart sounds. No murmur heard.    No friction rub. No gallop.  Pulmonary:     Effort: Pulmonary effort is normal. No respiratory distress.     Breath sounds: Normal breath sounds. No wheezing, rhonchi or rales.  Abdominal:     General: Bowel sounds are normal. There is no distension.     Palpations: Abdomen is soft.     Tenderness: There is no abdominal tenderness. There is no guarding or rebound.  Musculoskeletal:        General: No swelling. Normal range of motion.     Cervical back: Normal range of motion.  Skin:    General: Skin is warm and dry.  Neurological:     General: No focal deficit present.     Mental Status: She is alert and oriented to person, place, and time.  Psychiatric:        Mood and Affect: Mood normal.        Behavior: Behavior normal.     Data Reviewed:     Latest Ref Rng & Units 01/03/2024    4:10 AM 01/02/2024    7:29 PM 09/13/2023   10:26 AM  CBC  WBC 4.0 - 10.5 K/uL 4.8  7.0  4.8   Hemoglobin 12.0 - 15.0 g/dL 9.2  14.7  82.9   Hematocrit 36.0 - 46.0 % 29.6  31.6  33.7   Platelets 150 - 400 K/uL 116  164  159       Latest Ref Rng & Units  01/03/2024    4:10 AM 01/02/2024    7:29 PM 09/13/2023   10:26 AM  CMP  Glucose 70 - 99 mg/dL 85  562  88   BUN 8 - 23 mg/dL 26  30  19    Creatinine 0.44 - 1.00 mg/dL 1.30  8.65  1.01   Sodium 135 - 145 mmol/L 136  136  137   Potassium 3.5 - 5.1 mmol/L 4.3  4.3  4.2   Chloride 98 - 111 mmol/L 109  101  102   CO2 22 - 32 mmol/L 19  23  22    Calcium 8.9 - 10.3 mg/dL 7.6  8.5  8.6   Total Protein 6.5 - 8.1 g/dL 5.8  6.6  7.2   Total Bilirubin 0.0 - 1.2 mg/dL 0.9  1.1  0.6   Alkaline Phos 38 - 126 U/L 30  36  52   AST 15 - 41 U/L 19  21  22    ALT 0 - 44 U/L 12  15  14     Lipase     Component Value Date/Time   LIPASE 40 01/02/2024 1929   Urinalysis    Component Value Date/Time   COLORURINE YELLOW 01/02/2024 2011   APPEARANCEUR CLEAR 01/02/2024 2011   LABSPEC 1.017 01/02/2024 2011   PHURINE 5.0 01/02/2024 2011   GLUCOSEU NEGATIVE 01/02/2024 2011   HGBUR NEGATIVE 01/02/2024 2011   BILIRUBINUR NEGATIVE 01/02/2024 2011   BILIRUBINUR negative 09/10/2021 1018   KETONESUR NEGATIVE 01/02/2024 2011   PROTEINUR NEGATIVE 01/02/2024 2011   UROBILINOGEN 0.2 09/10/2021 1018   NITRITE NEGATIVE 01/02/2024 2011   LEUKOCYTESUR MODERATE (A) 01/02/2024 2011    CT ABDOMEN PELVIS WO CONTRAST Result Date: 01/03/2024 CLINICAL DATA:  Abdominal pain EXAM: CT ABDOMEN AND PELVIS WITHOUT CONTRAST TECHNIQUE: Multidetector CT imaging of the abdomen and pelvis was performed following the standard protocol without IV contrast. RADIATION DOSE REDUCTION: This exam was performed according to the departmental dose-optimization program which includes automated exposure control, adjustment of the mA and/or kV according to patient size and/or use of iterative reconstruction technique. COMPARISON:  None Available. FINDINGS: Lower chest: Ground-glass opacities in the lower lobes, likely atelectasis. No effusions. Hepatobiliary: No focal hepatic abnormality. Gallbladder unremarkable. Pancreas: No focal abnormality or  ductal dilatation. Spleen: No focal abnormality.  Normal size. Adrenals/Urinary Tract: No adrenal abnormality. No focal renal abnormality. No stones or hydronephrosis. Urinary bladder is unremarkable. Stomach/Bowel: Stomach, large and small bowel grossly unremarkable. Vascular/Lymphatic: Aortic atherosclerosis. No evidence of aneurysm or adenopathy. Reproductive: Uterus and adnexa unremarkable.  No mass. Other: No free fluid or free air. Musculoskeletal: No acute bony abnormality. IMPRESSION: No acute findings in the abdomen or pelvis. Aortic atherosclerosis. Electronically Signed   By: Charlett Nose M.D.   On: 01/03/2024 00:42     Assessment and Plan: No notes have been filed under this hospital service. Service: Hospitalist  AKI Patient presenting with an AKI in the setting of likely decreased oral intake per patient.  Her baseline creatinine appears to be around 1.  On admission, creatinine was 2.15.  She has since received 1 L IV fluids and was placed on maintenance fluids thereafter.  Kidney function this morning does appear to have improved with creatinine 1.55, consistent with likely prerenal etiology.  Urine sodium and urine creatinine are ordered and pending collection.  UA is without infection.  Will check renal ultrasound. -Follow-up urine creatinine and urine sodium -Follow-up renal ultrasound -Continue maintenance IV fluids -Trend kidney function -Strict I/O's, monitor urine output -avoid nephrotoxic medications as able -SCDs for DVT ppx -place in observation  Generalized abdominal pain Unclear etiology.  Patient denies any associated systemic symptoms, including no fevers, chills, diarrhea, constipation, rectal bleeding per patient.  CT abdomen pelvis did not reveal any acute findings.  She does report that her abdominal  pain has improved.  Chronic anemia Patient with chronic anemia dating back to about 2 to 3 years ago, baseline appears to be around 10.  On admission, hemoglobin  level 10.2.  Hemoglobin did downtrend to 9.2 this morning, though question if this is dilutional in the setting of receiving IV fluids overnight.  Will check H&H to confirm. No reported bleeding noted. Will use SCDs for DVT ppx for now. -Trend hemoglobin curve -f/u H&H -Follow-up iron studies -Transfuse if hemoglobin less than 7  HTN -Home regimen includes amlodipine 10 mg daily, losartan 100 mg daily, Toprol-XL 25 mg every evening -BP ranging in the 130s-140s/60s since arrival to the ED -Resume home amlodipine and Toprol-XL -Holding losartan given AKI  Prediabetes with hyperglycemia -A1c 5.8% about 3 months ago -not on any home medications for this -trend CBGs, goal 140-180 -consider adding SSI if CBGs above goal   Advance Care Planning:   Code Status: Full Code   Consults: none  Family Communication: updated family at bedside  Severity of Illness: The appropriate patient status for this patient is OBSERVATION. Observation status is judged to be reasonable and necessary in order to provide the required intensity of service to ensure the patient's safety. The patient's presenting symptoms, physical exam findings, and initial radiographic and laboratory data in the context of their medical condition is felt to place them at decreased risk for further clinical deterioration. Furthermore, it is anticipated that the patient will be medically stable for discharge from the hospital within 2 midnights of admission.   Portions of this note were generated with Scientist, clinical (histocompatibility and immunogenetics). Dictation errors may occur despite best attempts at proofreading.   Author: Briscoe Burns, MD 01/03/2024 7:05 AM  For on call review [email or web address].

## 2024-01-03 NOTE — Plan of Care (Signed)
  Problem: Clinical Measurements: Goal: Respiratory complications will improve Outcome: Progressing   Problem: Activity: Goal: Risk for activity intolerance will decrease Outcome: Progressing   Problem: Nutrition: Goal: Adequate nutrition will be maintained Outcome: Progressing   Problem: Elimination: Goal: Will not experience complications related to bowel motility Outcome: Progressing

## 2024-01-03 NOTE — ED Provider Notes (Signed)
[place] EMERGENCY DEPARTMENT AT [facility] Provider Note   CSN: [id number] Arrival date & time: 01/02/24  1903     History  Chief Complaint  Patient presents with   Abdominal Pain   Near Syncope         Laura Harris is a 68 y.o. female.  The history is provided by the patient. A language interpreter was used.  Abdominal Pain Near Syncope Associated symptoms include abdominal pain.  She has history of hypertension, hyperlipidemia, chronic kidney disease.  Comes in with periumbilical pain which started this morning.  Pain does radiate to the back.  She denies nausea or vomiting.  She denies constipation or diarrhea.  She denies fever or chills.  She did recently have a long airplane flight from [place], but she denies any chest pain or dyspnea.   Home Medications Prior to Admission medications   Medication Sig Start Date End Date Taking? Authorizing Provider  alendronate (FOSAMAX) 70 MG tablet Take 1 tablet (70 mg total) by mouth every 7 (seven) days. Take with a full glass of water on an empty stomach. 09/12/23   Julieanne Manson, MD  amLODipine (NORVASC) 10 MG tablet TAKE 1 TABLET BY MOUTH ONCE DAILY WITH MORNING MEAL 09/12/23   Julieanne Manson, MD  Calcium Citrate 250 MG TABS Take 2 tablets (500 mg total) by mouth 2 (two) times daily. Patient not taking: Reported on 09/12/2023 12/12/22   Julieanne Manson, MD  Cholecalciferol (VITAMIN D3) 25 MCG (1000 UT) CAPS Take 1 capsule (1,000 Units total) by mouth daily. Patient not taking: Reported on 09/12/2023 12/12/22   Julieanne Manson, MD  fluocinonide (LIDEX) 0.05 % external solution Apply to affected areas of scalp twice daily as needed. 09/12/23   Julieanne Manson, MD  hydrOXYzine (ATARAX) 25 MG tablet Take 1 tablet (25 mg total) by mouth every 8 (eight) hours as needed. Patient not taking: Reported on 12/09/2022 06/30/22   Merrilee Jansky, MD  losartan (COZAAR) 100 MG tablet Take 1 tablet (100 mg total) by  mouth daily. 09/12/23   Julieanne Manson, MD  methylcellulose (CITRUCEL) oral powder Take 1 packet by mouth daily. Patient not taking: Reported on 09/12/2023 05/18/22   Rachael Fee, MD  metoprolol succinate (TOPROL-XL) 25 MG 24 hr tablet TAKE 1 TABLET BY MOUTH ONCE DAILY WITH EVENING MEAL 09/12/23   Julieanne Manson, MD  triamcinolone cream (KENALOG) 0.1 % Apply to affected area twice daily as needed. 12/09/22   Julieanne Manson, MD      Allergies    Patient has no known allergies.    Review of Systems   Review of Systems  Cardiovascular:  Positive for near-syncope.  Gastrointestinal:  Positive for abdominal pain.  All other systems reviewed and are negative.   Physical Exam Updated Vital Signs BP 133/80 (BP Location: Left Wrist)   Pulse (!) 57   Temp (!) 97.4 F (36.3 C) (Oral)   Resp 12   SpO2 100%  Physical Exam Vitals and nursing note reviewed.   68 year old female, resting comfortably and in no acute distress. Vital signs are significant for slightly slow heart rate. Oxygen saturation is 100%, which is normal. Head is normocephalic and atraumatic. PERRLA, EOMI. Oropharynx is clear. Neck is nontender and supple without adenopathy. Lungs are clear without rales, wheezes, or rhonchi. Chest is nontender. Heart has regular rate and rhythm without murmur. Abdomen is soft, flat, with mild periumbilical tenderness.  There is no rebound or guarding. Extremities have no cyanosis or  edema, full range of motion is present. Skin is warm and dry without rash. Neurologic: Awake and alert, cranial nerves are intact, moves all extremities equally.  ED Results / Procedures / Treatments   Labs (all labs ordered are listed, but only abnormal results are displayed) Labs Reviewed  COMPREHENSIVE METABOLIC PANEL - Abnormal; Notable for the following components:      Result Value   Glucose, Bld 136 (*)    BUN 30 (*)    Creatinine, Ser 2.15 (*)    Calcium 8.5 (*)    Alkaline  Phosphatase 36 (*)    GFR, Estimated 25 (*)    All other components within normal limits  CBC WITH DIFFERENTIAL/PLATELET - Abnormal; Notable for the following components:   RBC 3.86 (*)    Hemoglobin 10.2 (*)    HCT 31.6 (*)    All other components within normal limits  URINALYSIS, ROUTINE W REFLEX MICROSCOPIC - Abnormal; Notable for the following components:   Leukocytes,Ua MODERATE (*)    All other components within normal limits  LIPASE, BLOOD    EKG EKG Interpretation Date/Time:  Tuesday January 02 2024 19:39:04 EST Ventricular Rate:  60 PR Interval:  164 QRS Duration:  90 QT Interval:  430 QTC Calculation: 430 R Axis:   66  Text Interpretation: Normal sinus rhythm RSR' or QR pattern in V1 suggests right ventricular conduction delay Cannot rule out Anterior infarct , age undetermined Abnormal ECG No previous ECGs available Confirmed by Dione Booze ([id number]) on 01/02/2024 11:47:40 PM  Radiology CT ABDOMEN PELVIS WO CONTRAST Result Date: 01/03/2024 CLINICAL DATA:  Abdominal pain EXAM: CT ABDOMEN AND PELVIS WITHOUT CONTRAST TECHNIQUE: Multidetector CT imaging of the abdomen and pelvis was performed following the standard protocol without IV contrast. RADIATION DOSE REDUCTION: This exam was performed according to the departmental dose-optimization program which includes automated exposure control, adjustment of the mA and/or kV according to patient size and/or use of iterative reconstruction technique. COMPARISON:  None Available. FINDINGS: Lower chest: Ground-glass opacities in the lower lobes, likely atelectasis. No effusions. Hepatobiliary: No focal hepatic abnormality. Gallbladder unremarkable. Pancreas: No focal abnormality or ductal dilatation. Spleen: No focal abnormality.  Normal size. Adrenals/Urinary Tract: No adrenal abnormality. No focal renal abnormality. No stones or hydronephrosis. Urinary bladder is unremarkable. Stomach/Bowel: Stomach, large and small bowel grossly  unremarkable. Vascular/Lymphatic: Aortic atherosclerosis. No evidence of aneurysm or adenopathy. Reproductive: Uterus and adnexa unremarkable.  No mass. Other: No free fluid or free air. Musculoskeletal: No acute bony abnormality. IMPRESSION: No acute findings in the abdomen or pelvis. Aortic atherosclerosis. Electronically Signed   By: Charlett Nose M.D.   On: 01/03/2024 00:42    Procedures Procedures    Medications Ordered in ED Medications - No data to display  ED Course/ Medical Decision Making/ A&P                                 Medical Decision Making Amount and/or Complexity of Data Reviewed Radiology: ordered.  Risk Prescription drug management. Decision regarding hospitalization.   Periumbilical pain, etiology unclear.  Differential includes, but is not limited to, diverticulitis, GERD, peptic ulcer disease, pancreatitis, cholecystitis, bowel obstruction, abdominal aneurysm.  I have reviewed her laboratory tests, and my interpretation is elevated random glucose level, creatinine elevated significantly higher than most recent value on 09/13/2023, stable anemia, normal WBC but with slight left shift, normal urinalysis.  Cause for her acute kidney injury is unclear.  She is on losartan which can be associated with renal failure.  She does not appear dehydrated.  I have ordered IV fluids, morphine for pain.  I have ordered a CT scan of abdomen and pelvis.  I have reviewed her past records, and she did have an elevated creatinine as high as 1.75 in 2017, but I do not see any reference to it and any contemporaneous notes.  I have reviewed her electrocardiogram, my interpretation is normal sinus rhythm of with possible right ventricular conduction delay but no acute ST or T changes, no prior ECGs available for comparison.  CT of abdomen and pelvis shows no acute process.  I have independently viewed the images, and agree with the radiologist's interpretation.  I have discussed the case  with Dr. Arlean Hopping of [facility] hospitalists, who agrees to admit the patient.  Final Clinical Impression(s) / ED Diagnoses Final diagnoses:  Acute kidney injury (nontraumatic) (HCC)  Normochromic normocytic anemia  Elevated random blood glucose level  Upper abdominal pain    Rx / DC Orders ED Discharge Orders     None         Dione Booze, MD 01/03/24 0202

## 2024-01-04 DIAGNOSIS — D649 Anemia, unspecified: Secondary | ICD-10-CM

## 2024-01-04 DIAGNOSIS — N179 Acute kidney failure, unspecified: Secondary | ICD-10-CM | POA: Diagnosis not present

## 2024-01-04 DIAGNOSIS — R739 Hyperglycemia, unspecified: Secondary | ICD-10-CM

## 2024-01-04 DIAGNOSIS — R1010 Upper abdominal pain, unspecified: Secondary | ICD-10-CM | POA: Diagnosis not present

## 2024-01-04 LAB — CBC
HCT: 29.9 % — ABNORMAL LOW (ref 36.0–46.0)
Hemoglobin: 9.7 g/dL — ABNORMAL LOW (ref 12.0–15.0)
MCH: 25.9 pg — ABNORMAL LOW (ref 26.0–34.0)
MCHC: 32.4 g/dL (ref 30.0–36.0)
MCV: 79.9 fL — ABNORMAL LOW (ref 80.0–100.0)
Platelets: 129 10*3/uL — ABNORMAL LOW (ref 150–400)
RBC: 3.74 MIL/uL — ABNORMAL LOW (ref 3.87–5.11)
RDW: 13.2 % (ref 11.5–15.5)
WBC: 4.1 10*3/uL (ref 4.0–10.5)
nRBC: 0 % (ref 0.0–0.2)

## 2024-01-04 LAB — BASIC METABOLIC PANEL
Anion gap: 7 (ref 5–15)
BUN: 16 mg/dL (ref 8–23)
CO2: 24 mmol/L (ref 22–32)
Calcium: 8.5 mg/dL — ABNORMAL LOW (ref 8.9–10.3)
Chloride: 107 mmol/L (ref 98–111)
Creatinine, Ser: 0.98 mg/dL (ref 0.44–1.00)
GFR, Estimated: >60 mL/min (ref >60)
Glucose, Bld: 82 mg/dL (ref 70–99)
Potassium: 4.1 mmol/L (ref 3.5–5.1)
Sodium: 138 mmol/L (ref 135–145)

## 2024-01-04 MED ORDER — ONDANSETRON 4 MG PO TBDP: 4.0000 mg | ORAL_TABLET | Freq: Three times a day (TID) | ORAL | 0 refills | Status: DC | PRN

## 2024-01-04 MED ORDER — HYDRALAZINE HCL 20 MG/ML IJ SOLN: 10.0000 mg | Freq: Four times a day (QID) | INTRAMUSCULAR | Status: DC | PRN

## 2024-01-04 MED ORDER — AMLODIPINE BESYLATE 10 MG PO TABS
10.0000 mg | ORAL_TABLET | Freq: Every day | ORAL | Status: DC
  Administered 2024-01-04: 10 mg via ORAL
  Filled 2024-01-04: qty 1

## 2024-01-04 MED ORDER — METOPROLOL SUCCINATE ER 25 MG PO TB24: 25.0000 mg | ORAL_TABLET | Freq: Every day | ORAL | Status: DC

## 2024-01-04 MED ORDER — METOPROLOL SUCCINATE ER 25 MG PO TB24
25.0000 mg | ORAL_TABLET | Freq: Every day | ORAL | Status: DC
  Administered 2024-01-04: 25 mg via ORAL
  Filled 2024-01-04: qty 1

## 2024-01-04 MED ORDER — AMLODIPINE BESYLATE 10 MG PO TABS: 10.0000 mg | ORAL_TABLET | Freq: Every day | ORAL | Status: DC

## 2024-01-04 MED ORDER — TRAMADOL HCL 50 MG PO TABS: 50.0000 mg | ORAL_TABLET | Freq: Three times a day (TID) | ORAL | 0 refills | Status: DC | PRN

## 2024-01-04 NOTE — Progress Notes (Signed)
DISCHARGE NOTE HOME [name] to be discharged Home per MD order. Discussed prescriptions and follow up appointments with the patient. Prescriptions given to patient; medication list explained in detail. Patient verbalized understanding.  Skin clean, dry and intact without evidence of skin break down, no evidence of skin tears noted. IV catheter discontinued intact. Site without signs and symptoms of complications. Dressing and pressure applied. Pt denies pain at the site currently. No complaints noted.  Patient free of lines, drains, and wounds.   An After Visit Summary (AVS) was printed and given to the patient. Patient escorted via wheelchair, and discharged home via private auto.  [name], RN

## 2024-01-04 NOTE — TOC Transition Note (Signed)
Transition of Care [facility]) - Discharge Note   Patient Details  Name: Laura Harris MRN: [id number] Date of Birth: October 31, 1956  Transition of Care [facility]) CM/SW Contact:  Tom-Johnson, Hershal Coria, RN Phone Number: 01/04/2024, 10:40 AM   Clinical Narrative:     Patient is scheduled for discharge today.  Outpatient f/u, hospital f/u and discharge instructions on AVS. No TOC needs or recommendations noted. Daughter, [name] to transport at discharge.  No further TOC needs noted.          Final next level of care: Home/Self Care Barriers to Discharge: Barriers Resolved   Patient Goals and CMS Choice Patient states their goals for this hospitalization and ongoing recovery are:: To return home CMS Medicare.gov Compare Post Acute Care list provided to:: Patient Choice offered to / list presented to : NA      Discharge Placement                Patient to be transferred to facility by: Daughter Name of family member notified: Alysia Penna    Discharge Plan and Services Additional resources added to the After Visit Summary for                  DME Arranged: N/A DME Agency: NA       HH Arranged: NA HH Agency: NA        Social Drivers of Health (SDOH) Interventions SDOH Screenings   Food Insecurity: No Food Insecurity (01/03/2024)  Housing: Low Risk  (01/03/2024)  Transportation Needs: No Transportation Needs (01/03/2024)  Utilities: Not At Risk (01/03/2024)  Depression (PHQ2-9): Low Risk  (08/28/2020)  Financial Resource Strain: Low Risk  (09/10/2021)  Social Connections: Moderately Integrated (01/03/2024)  Stress: No Stress Concern Present (08/28/2020)  Tobacco Use: Low Risk  (01/03/2024)     Readmission Risk Interventions     No data to display

## 2024-01-04 NOTE — Discharge Summary (Signed)
Physician Discharge Summary   Patient: Laura Harris MRN: [id number] DOB: 1956-10-10  Admit date:     01/02/2024  Discharge date: 01/04/24  Discharge Physician: Thad Ranger, MD    PCP: Julieanne Manson, MD   Recommendations at discharge:   Hold Cozaar, patient may resume it on 01/06/2024  Discharge Diagnoses:    AKI (acute kidney injury) [facility]) Dehydration Acute viral gastroenteritis Hypertension Chronic anemia  Hospital Course: Patient is a 68 year old female with HTN, chronic anemia, prediabetes presented to ED with new onset abdominal discomfort for the past 2 days.  Patient had recently returned from a trip to [place] where she was visiting her family.  She was there for about 3 months and returned on Sunday, 3 days prior to admission.  Per patient, in the past 2 days she was having generalized abdominal discomfort.  She had been eating and drinking however decreased appetite.  She also reports that she may not have been drinking enough water during the trip and [place].  No acute nausea vomiting, chest pain, shortness of breath, diarrhea. CT abdomen pelvis did not reveal any acute findings.  CMP with mild hyperglycemia to 136, creatinine 2.15 (baseline appears to be around 1). Lipase normal. UA without infection.    Assessment and Plan:   Acute kidney injury -Likely in the setting of decreased oral intake, severe dehydration, medication effect -Creatinine 2.15 on admission, baseline normal. -Losartan was held, patient was placed on IV fluid hydration. -Creatinine function now normal, 0.9 at discharge Renal ultrasound showed left renal atrophy otherwise no acute or reversible findings  Generalized abdominal pain -Unclear etiology, CT abdomen showed no acute findings. -At this time abdominal pain has improved, tolerating regular diet without difficulty  Chronic anemia, microcytic -Patient has history of chronic anemia, hemoglobin 9.2 on admission -Anemia profile showed  ferritin under 11, iron 31, percent saturation ratio 13 -Hemoglobin 9.7 at discharge.  No obvious bleeding.  Hypertension -BP stable, continue amlodipine, Toprol-XL -Hold losartan for another 2 days.  Prediabetes with hyperglycemia -Currently CBG stable, A1c 5.8% on 09/13/2023      Pain control - [place] Controlled Substance Reporting System database was reviewed. and patient was instructed, not to drive, operate heavy machinery, perform activities at heights, swimming or participation in water activities or provide baby-sitting services while on Pain, Sleep and Anxiety Medications; until their outpatient Physician has advised to do so again. Also recommended to not to take more than prescribed Pain, Sleep and Anxiety Medications.  Consultants: None Procedures performed: None Disposition: Home Diet recommendation:  Discharge Diet Orders (From admission, onward)     Start     Ordered   01/04/24 0000  Diet - low sodium heart healthy        02 /13/25 0957            DISCHARGE MEDICATION: Allergies as of 01/04/2024   No Known Allergies      Medication List     PAUSE taking these medications    losartan 100 MG tablet Wait to take this until: January 06, 2024 Commonly known as: COZAAR Take 1 tablet (100 mg total) by mouth daily.       TAKE these medications    acetaminophen 325 MG tablet Commonly known as: TYLENOL Take 650 mg by mouth every 6 (six) hours as needed for headache or mild pain (pain score 1-3).   alendronate 70 MG tablet Commonly known as: FOSAMAX Take 1 tablet (70 mg total) by mouth every 7 (seven) days. Take with a  full glass of water on an empty stomach.   amLODipine 10 MG tablet Commonly known as: NORVASC TAKE 1 TABLET BY MOUTH ONCE DAILY WITH MORNING MEAL   Calcium Citrate 250 MG Tabs Take 2 tablets (500 mg total) by mouth 2 (two) times daily.   fluocinonide 0.05 % external solution Commonly known as: LIDEX Apply to affected areas  of scalp twice daily as needed.   metoprolol succinate 25 MG 24 hr tablet Commonly known as: TOPROL-XL TAKE 1 TABLET BY MOUTH ONCE DAILY WITH EVENING MEAL   ondansetron 4 MG disintegrating tablet Commonly known as: ZOFRAN-ODT Take 1 tablet (4 mg total) by mouth every 8 (eight) hours as needed for nausea or vomiting.   traMADol 50 MG tablet Commonly known as: Ultram Take 1 tablet (50 mg total) by mouth every 8 (eight) hours as needed for moderate pain (pain score 4-6) or severe pain (pain score 7-10).   triamcinolone cream 0.1 % Commonly known as: KENALOG Apply to affected area twice daily as needed.   Vitamin D3 25 MCG (1000 UT) Caps Take 1 capsule (1,000 Units total) by mouth daily.        Follow-up Information     Julieanne Manson, MD. Schedule an appointment as soon as possible for a visit in 2 week(s).   Specialty: Internal Medicine Why: for hospital follow-up Contact information: [address] [place] [address] [phone]                Discharge Exam:   S: No acute abdominal pain, no nausea or vomiting, eating breakfast without difficulty.  Daughter at the bedside.  Feels a lot better, no abdominal pain today.  Wants to go home.  BP (!) 162/76 (BP Location: Left Arm)   Pulse 68   Temp 98.3 F (36.8 C) (Oral)   Resp 15   SpO2 100%    Physical Exam General: Alert and oriented x 3, NAD Cardiovascular: S1 S2 clear, RRR.  Respiratory: CTAB, no wheezing, rales or rhonchi Gastrointestinal: Soft, nontender, nondistended, NBS Ext: no pedal edema bilaterally Neuro: no new deficits Skin: No rashes Psych: Normal affect    Condition at discharge: fair  The results of significant diagnostics from this hospitalization (including imaging, microbiology, ancillary and laboratory) are listed below for reference.   Imaging Studies: US RENAL Result Date: 01/03/2024 CLINICAL DATA:  Acute kidney injury EXAM: RENAL / URINARY TRACT ULTRASOUND COMPLETE  COMPARISON:  Abdominal CT from earlier today FINDINGS: Right Kidney: Renal measurements: 10 x 4 x 6 cm = volume: 120 mL. Echogenicity within normal limits. No solid mass or hydronephrosis visualized. 7 mm non worrisome cyst. Left Kidney: Renal measurements: 9 x 4 x 4 cm = volume: 80 mL. Relative atrophy was also seen on prior CT. Normal cortical echogenicity. No mass or hydronephrosis visualized. Bladder: Appears normal for degree of bladder distention. IMPRESSION: 1. Left renal atrophy. 2. No acute or reversible finding. Electronically Signed   By: Tiburcio Pea M.D.   On: 01/03/2024 08:20   CT ABDOMEN PELVIS WO CONTRAST Result Date: 01/03/2024 CLINICAL DATA:  Abdominal pain EXAM: CT ABDOMEN AND PELVIS WITHOUT CONTRAST TECHNIQUE: Multidetector CT imaging of the abdomen and pelvis was performed following the standard protocol without IV contrast. RADIATION DOSE REDUCTION: This exam was performed according to the departmental dose-optimization program which includes automated exposure control, adjustment of the mA and/or kV according to patient size and/or use of iterative reconstruction technique. COMPARISON:  None Available. FINDINGS: Lower chest: Ground-glass opacities in the  lower lobes, likely atelectasis. No effusions. Hepatobiliary: No focal hepatic abnormality. Gallbladder unremarkable. Pancreas: No focal abnormality or ductal dilatation. Spleen: No focal abnormality.  Normal size. Adrenals/Urinary Tract: No adrenal abnormality. No focal renal abnormality. No stones or hydronephrosis. Urinary bladder is unremarkable. Stomach/Bowel: Stomach, large and small bowel grossly unremarkable. Vascular/Lymphatic: Aortic atherosclerosis. No evidence of aneurysm or adenopathy. Reproductive: Uterus and adnexa unremarkable.  No mass. Other: No free fluid or free air. Musculoskeletal: No acute bony abnormality. IMPRESSION: No acute findings in the abdomen or pelvis. Aortic atherosclerosis. Electronically Signed   By:  Charlett Nose M.D.   On: 01/03/2024 00:42    Microbiology: Results for orders placed or performed in visit on 04/27/19  Novel Coronavirus, NAA ([facility])     Status: None   Collection Time: 04/27/19 12:00 AM  Result Value Ref Range Status   SARS-CoV-2, NAA Not Detected Not Detected Final    Comment: This test was developed and its performance characteristics determined by [employer]. This test has not been FDA cleared or approved. This test has been authorized by FDA under an Emergency Use Authorization (EUA). This test is only authorized for the duration of time the declaration that circumstances exist justifying the authorization of the emergency use of in vitro diagnostic tests for detection of SARS-CoV-2 virus and/or diagnosis of COVID-19 infection under section 564(b)(1) of the Act, 21 U.S.C. 130QMV-7(Q)(4), unless the authorization is terminated or revoked sooner. When diagnostic testing is negative, the possibility of a false negative result should be considered in the context of a patient's recent exposures and the presence of clinical signs and symptoms consistent with COVID-19. An individual without symptoms of COVID-19 and who is not shedding SARS-CoV-2 virus would expect to have a negative (not detected) result in this assay.     Labs: CBC: Recent Labs  Lab 01/02/24 1929 01/03/24 0410 01/03/24 0807 01/04/24 0417  WBC 7.0 4.8  --  4.1  NEUTROABS 5.6 3.3  --   --   HGB 10.2* 9.2* 9.3* 9.7*  HCT 31.6* 29.6* 29.6* 29.9*  MCV 81.9 83.9  --  79.9*  PLT 164 116*  --  129*   Basic Metabolic Panel: Recent Labs  Lab 01/02/24 1929 01/03/24 0410 01/04/24 0417  NA 136 136 138  K 4.3 4.3 4.1  CL 101 109 107  CO2 23 19* 24  GLUCOSE 136* 85 82  BUN 30* 26* 16  CREATININE 2.15* 1.55* 0.98  CALCIUM 8.5* 7.6* 8.5*  MG  --  1.8  --    Liver Function Tests: Recent Labs  Lab 01/02/24 1929 01/03/24 0410  AST 21 19  ALT 15 12  ALKPHOS 36* 30*  BILITOT 1.1  0.9  PROT 6.6 5.8*  ALBUMIN 3.5 3.1*   CBG: No results for input(s): "GLUCAP" in the last 168 hours.  Discharge time spent: greater than 30 minutes.  Signed: Thad Ranger, MD [facility] 01/04/2024

## 2024-01-05 ENCOUNTER — Telehealth: Payer: Self-pay

## 2024-01-05 NOTE — Telephone Encounter (Signed)
Patient needs an after ED appointment. Patient on the wait list for next available appointment.

## 2024-01-08 ENCOUNTER — Emergency Department (HOSPITAL_COMMUNITY): Payer: Medicaid Other

## 2024-01-08 ENCOUNTER — Inpatient Hospital Stay (HOSPITAL_COMMUNITY)
Admission: EM | Admit: 2024-01-08 | Discharge: 2024-01-11 | DRG: 642 | Disposition: A | Discharge status: Home / Self Care | Payer: Medicaid Other | Attending: Internal Medicine | Admitting: Internal Medicine

## 2024-01-08 ENCOUNTER — Encounter (HOSPITAL_COMMUNITY): Payer: Self-pay

## 2024-01-08 DIAGNOSIS — Z7983 Long term (current) use of bisphosphonates: Secondary | ICD-10-CM

## 2024-01-08 DIAGNOSIS — E861 Hypovolemia: Secondary | ICD-10-CM | POA: Diagnosis present

## 2024-01-08 DIAGNOSIS — Z79899 Other long term (current) drug therapy: Secondary | ICD-10-CM

## 2024-01-08 DIAGNOSIS — D649 Anemia, unspecified: Secondary | ICD-10-CM | POA: Diagnosis present

## 2024-01-08 DIAGNOSIS — Z833 Family history of diabetes mellitus: Secondary | ICD-10-CM

## 2024-01-08 DIAGNOSIS — E785 Hyperlipidemia, unspecified: Secondary | ICD-10-CM | POA: Diagnosis present

## 2024-01-08 DIAGNOSIS — R7989 Other specified abnormal findings of blood chemistry: Secondary | ICD-10-CM | POA: Diagnosis present

## 2024-01-08 DIAGNOSIS — R112 Nausea with vomiting, unspecified: Secondary | ICD-10-CM | POA: Diagnosis present

## 2024-01-08 DIAGNOSIS — K5900 Constipation, unspecified: Secondary | ICD-10-CM | POA: Diagnosis present

## 2024-01-08 DIAGNOSIS — Z8349 Family history of other endocrine, nutritional and metabolic diseases: Secondary | ICD-10-CM

## 2024-01-08 DIAGNOSIS — E871 Hypo-osmolality and hyponatremia: Principal | ICD-10-CM | POA: Diagnosis present

## 2024-01-08 DIAGNOSIS — Z841 Family history of disorders of kidney and ureter: Secondary | ICD-10-CM

## 2024-01-08 DIAGNOSIS — D509 Iron deficiency anemia, unspecified: Secondary | ICD-10-CM | POA: Diagnosis present

## 2024-01-08 DIAGNOSIS — Z818 Family history of other mental and behavioral disorders: Secondary | ICD-10-CM

## 2024-01-08 DIAGNOSIS — Z8249 Family history of ischemic heart disease and other diseases of the circulatory system: Secondary | ICD-10-CM

## 2024-01-08 DIAGNOSIS — K30 Functional dyspepsia: Secondary | ICD-10-CM | POA: Diagnosis present

## 2024-01-08 DIAGNOSIS — N179 Acute kidney failure, unspecified: Secondary | ICD-10-CM | POA: Diagnosis present

## 2024-01-08 DIAGNOSIS — I129 Hypertensive chronic kidney disease with stage 1 through stage 4 chronic kidney disease, or unspecified chronic kidney disease: Secondary | ICD-10-CM | POA: Diagnosis present

## 2024-01-08 LAB — CBC WITH DIFFERENTIAL/PLATELET
Abs Immature Granulocytes: 0.02 10*3/uL (ref 0.00–0.07)
Basophils Absolute: 0 10*3/uL (ref 0.0–0.1)
Basophils Relative: 0 %
Eosinophils Absolute: 0.1 10*3/uL (ref 0.0–0.5)
Eosinophils Relative: 1 %
HCT: 32.3 % — ABNORMAL LOW (ref 36.0–46.0)
Hemoglobin: 11 g/dL — ABNORMAL LOW (ref 12.0–15.0)
Immature Granulocytes: 0 %
Lymphocytes Relative: 14 %
Lymphs Abs: 1 10*3/uL (ref 0.7–4.0)
MCH: 25.9 pg — ABNORMAL LOW (ref 26.0–34.0)
MCHC: 34.1 g/dL (ref 30.0–36.0)
MCV: 76.2 fL — ABNORMAL LOW (ref 80.0–100.0)
Monocytes Absolute: 0.5 10*3/uL (ref 0.1–1.0)
Monocytes Relative: 7 %
Neutro Abs: 5.4 10*3/uL (ref 1.7–7.7)
Neutrophils Relative %: 78 %
Platelets: 195 10*3/uL (ref 150–400)
RBC: 4.24 MIL/uL (ref 3.87–5.11)
RDW: 12.2 % (ref 11.5–15.5)
WBC: 7 10*3/uL (ref 4.0–10.5)
nRBC: 0 % (ref 0.0–0.2)

## 2024-01-08 LAB — COMPREHENSIVE METABOLIC PANEL
ALT: 13 U/L (ref 0–44)
AST: 22 U/L (ref 15–41)
Albumin: 3.9 g/dL (ref 3.5–5.0)
Alkaline Phosphatase: 39 U/L (ref 38–126)
Anion gap: 12 (ref 5–15)
BUN: 8 mg/dL (ref 8–23)
CO2: 21 mmol/L — ABNORMAL LOW (ref 22–32)
Calcium: 8.4 mg/dL — ABNORMAL LOW (ref 8.9–10.3)
Chloride: 86 mmol/L — ABNORMAL LOW (ref 98–111)
Creatinine, Ser: 1.37 mg/dL — ABNORMAL HIGH (ref 0.44–1.00)
GFR, Estimated: 43 mL/min — ABNORMAL LOW (ref >60)
Glucose, Bld: 143 mg/dL — ABNORMAL HIGH (ref 70–99)
Potassium: 3.7 mmol/L (ref 3.5–5.1)
Sodium: 119 mmol/L — CL (ref 135–145)
Total Bilirubin: 0.9 mg/dL (ref 0.0–1.2)
Total Protein: 7.1 g/dL (ref 6.5–8.1)

## 2024-01-08 LAB — URINALYSIS, ROUTINE W REFLEX MICROSCOPIC
Bilirubin Urine: NEGATIVE
Glucose, UA: NEGATIVE mg/dL
Hgb urine dipstick: NEGATIVE
Ketones, ur: 5 mg/dL — AB
Leukocytes,Ua: NEGATIVE
Nitrite: NEGATIVE
Protein, ur: NEGATIVE mg/dL
Specific Gravity, Urine: 1.003 — ABNORMAL LOW (ref 1.005–1.030)
pH: 6 (ref 5.0–8.0)

## 2024-01-08 LAB — LIPASE, BLOOD: Lipase: 48 U/L (ref 11–51)

## 2024-01-08 MED ORDER — ONDANSETRON 4 MG PO TBDP
8.0000 mg | ORAL_TABLET | Freq: Once | ORAL | Status: AC
  Administered 2024-01-08: 8 mg via ORAL
  Filled 2024-01-08: qty 2

## 2024-01-08 MED ORDER — SODIUM CHLORIDE 0.9 % IV BOLUS
1000.0000 mL | Freq: Once | INTRAVENOUS | Status: AC
  Administered 2024-01-08: 1000 mL via INTRAVENOUS

## 2024-01-08 MED ORDER — IOHEXOL 350 MG/ML SOLN
75.0000 mL | Freq: Once | INTRAVENOUS | Status: AC | PRN
  Administered 2024-01-08: 75 mL via INTRAVENOUS

## 2024-01-08 NOTE — ED Triage Notes (Signed)
Pt arrives with c/o ABD pain that started last week. Pt was admitted last week for AKI and discharged recently. Per family, ABD pain never fully went away. Pt reports nausea, decreased PO intake, and lightheadedness.

## 2024-01-08 NOTE — Telephone Encounter (Signed)
 Patient has been schedule.

## 2024-01-08 NOTE — ED Provider Triage Note (Signed)
Emergency Medicine Provider Triage Evaluation Note  Laura Harris , a 69 y.o. female  was evaluated in triage.  Pt complains of abdominal pain.  Recently admitted for AKI and abdominal pain.  Discharged 4 days ago.  Did have a reassuring CT scan during that time.  States her abdominal pain is all about the lower abdomen.  Started vomiting here in the ED.  Endorses nausea.   Review of Systems  Positive: See above Negative: See above  Physical Exam  BP (!) 156/72 (BP Location: Right Arm)   Pulse 75   Temp 97.9 F (36.6 C)   Resp (!) 22   Wt 46.7 kg   SpO2 100%   BMI 20.12 kg/m  Gen:   Awake, no distress   Resp:  Normal effort  MSK:   Moves extremities without difficulty  Other:  Lower abdominal tenderness  Medical Decision Making  Medically screening exam initiated at 9:25 PM.  Appropriate orders placed.  Laura Harris was informed that the remainder of the evaluation will be completed by another provider, this initial triage assessment does not replace that evaluation, and the importance of remaining in the ED until their evaluation is complete.  Work up started   Gareth Eagle, Cordelia Poche 01/08/24 2127

## 2024-01-08 NOTE — ED Provider Notes (Incomplete)
[place] EMERGENCY DEPARTMENT AT [facility] Provider Note   CSN: [id number] Arrival date & time: 01/08/24  2058     History {Add pertinent medical, surgical, social history, OB history to HPI:1} Chief Complaint  Patient presents with  . Abdominal Pain    Laura Harris is a 68 y.o. female with past medical history seen for hypertension, CKD, hyperlipidemia, elevated blood sugar who presents concern for diffuse of abdominal pain started last week which has not improved since then, admitted for recent AKI, recently discharged.  Abdominal pain never fully went away, patient with nausea, decreased p.o. intake, lightheadedness.  Nausea, headache, general weakness, could not get up by herself today.   Abdominal Pain      Home Medications Prior to Admission medications   Medication Sig Start Date End Date Taking? Authorizing Provider  acetaminophen (TYLENOL) 325 MG tablet Take 650 mg by mouth every 6 (six) hours as needed for headache or mild pain (pain score 1-3).    [provider]  alendronate (FOSAMAX) 70 MG tablet Take 1 tablet (70 mg total) by mouth every 7 (seven) days. Take with a full glass of water on an empty stomach. 09/12/23   Julieanne Manson, MD  amLODipine (NORVASC) 10 MG tablet TAKE 1 TABLET BY MOUTH ONCE DAILY WITH MORNING MEAL 09/12/23   Julieanne Manson, MD  Calcium Citrate 250 MG TABS Take 2 tablets (500 mg total) by mouth 2 (two) times daily. 12/12/22   Julieanne Manson, MD  Cholecalciferol (VITAMIN D3) 25 MCG (1000 UT) CAPS Take 1 capsule (1,000 Units total) by mouth daily. 12/12/22   Julieanne Manson, MD  fluocinonide (LIDEX) 0.05 % external solution Apply to affected areas of scalp twice daily as needed. 09/12/23   Julieanne Manson, MD  losartan (COZAAR) 100 MG tablet Take 1 tablet (100 mg total) by mouth daily. 09/12/23   Julieanne Manson, MD  metoprolol succinate (TOPROL-XL) 25 MG 24 hr tablet TAKE 1 TABLET BY MOUTH ONCE DAILY WITH  EVENING MEAL 09/12/23   Julieanne Manson, MD  ondansetron (ZOFRAN-ODT) 4 MG disintegrating tablet Take 1 tablet (4 mg total) by mouth every 8 (eight) hours as needed for nausea or vomiting. 01/04/24   Rai, Delene Ruffini, MD  traMADol (ULTRAM) 50 MG tablet Take 1 tablet (50 mg total) by mouth every 8 (eight) hours as needed for moderate pain (pain score 4-6) or severe pain (pain score 7-10). 01/04/24 01/03/25  Rai, Ripudeep K, MD  triamcinolone cream (KENALOG) 0.1 % Apply to affected area twice daily as needed. 12/09/22   Julieanne Manson, MD      Allergies    Patient has no known allergies.    Review of Systems   Review of Systems  Gastrointestinal:  Positive for abdominal pain.  All other systems reviewed and are negative.   Physical Exam Updated Vital Signs BP (!) 156/72 (BP Location: Right Arm)   Pulse 75   Temp 97.9 F (36.6 C)   Resp (!) 22   Wt 46.7 kg   SpO2 100%   BMI 20.12 kg/m  Physical Exam Vitals and nursing note reviewed.  Constitutional:      General: She is not in acute distress.    Appearance: Normal appearance.  HENT:     Head: Normocephalic and atraumatic.  Eyes:     General:        Right eye: No discharge.        Left eye: No discharge.  Cardiovascular:     Rate and Rhythm:  Normal rate and regular rhythm.     Heart sounds: No murmur heard.    No friction rub. No gallop.  Pulmonary:     Effort: Pulmonary effort is normal.     Breath sounds: Normal breath sounds.  Abdominal:     General: Bowel sounds are normal.     Palpations: Abdomen is soft.     Comments: Diffusely tender, most focally over suprapubic region with no rebound, rigidity  Skin:    General: Skin is warm and dry.     Capillary Refill: Capillary refill takes less than 2 seconds.  Neurological:     Mental Status: She is alert and oriented to person, place, and time.     Comments: At neurologic baseline but weak per her daughter, responding to commands appropriately.  Psychiatric:         Mood and Affect: Mood normal.        Behavior: Behavior normal.     ED Results / Procedures / Treatments   Labs (all labs ordered are listed, but only abnormal results are displayed) Labs Reviewed  CBC WITH DIFFERENTIAL/PLATELET - Abnormal; Notable for the following components:      Result Value   Hemoglobin 11.0 (*)    HCT 32.3 (*)    MCV 76.2 (*)    MCH 25.9 (*)    All other components within normal limits  COMPREHENSIVE METABOLIC PANEL - Abnormal; Notable for the following components:   Sodium 119 (*)    Chloride 86 (*)    CO2 21 (*)    Glucose, Bld 143 (*)    Creatinine, Ser 1.37 (*)    Calcium 8.4 (*)    GFR, Estimated 43 (*)    All other components within normal limits  URINALYSIS, ROUTINE W REFLEX MICROSCOPIC - Abnormal; Notable for the following components:   Color, Urine STRAW (*)    Specific Gravity, Urine 1.003 (*)    Ketones, ur 5 (*)    All other components within normal limits  LIPASE, BLOOD    EKG None  Radiology No results found.  Procedures .Critical Care  Performed by: Olene Floss, PA-C Authorized by: Olene Floss, PA-C   Critical care provider statement:    Critical care time (minutes):  35   Critical care was necessary to treat or prevent imminent or life-threatening deterioration of the following conditions:  Metabolic crisis   Critical care was time spent personally by me on the following activities:  Development of treatment plan with patient or surrogate, discussions with consultants, evaluation of patient's response to treatment, examination of patient, ordering and review of laboratory studies, ordering and review of radiographic studies, ordering and performing treatments and interventions, pulse oximetry, re-evaluation of patient's condition and review of old charts   Care discussed with: admitting provider     {Document cardiac monitor, telemetry assessment procedure when appropriate:1}  Medications Ordered in  ED Medications  ondansetron (ZOFRAN-ODT) disintegrating tablet 8 mg (8 mg Oral Given 01/08/24 2149)  iohexol (OMNIPAQUE) 350 MG/ML injection 75 mL (75 mLs Intravenous Contrast Given 01/08/24 2248)    ED Course/ Medical Decision Making/ A&P   {   Click here for ABCD2, HEART and other calculatorsREFRESH Note before signing :1}                              Medical Decision Making  This patient is a 68 y.o. female  who presents to the ED  for concern of ongoing abdominal pain, weakness, nausea.   Differential diagnoses prior to evaluation: The emergent differential diagnosis includes, but is not limited to,  CVA, spinal cord injury, ACS, arrhythmia, syncope, orthostatic hypotension, sepsis, hypoglycemia, hypoxia, electrolyte disturbance, endocrine disorder, anemia, environmental exposure, polypharmacy, The causes of generalized abdominal pain include but are not limited to AAA, mesenteric ischemia, appendicitis, diverticulitis, DKA, gastritis, gastroenteritis, AMI, nephrolithiasis, pancreatitis, peritonitis, adrenal insufficiency,lead poisoning, iron toxicity, intestinal ischemia, constipation, UTI,SBO/LBO, splenic rupture, biliary disease, IBD, IBS, PUD, or hepatitis . This is not an exhaustive differential.   Past Medical History / Co-morbidities / Social History: hypertension, CKD, hyperlipidemia, elevated blood sugar  Physical Exam: Physical exam performed. The pertinent findings include: At neurologic baseline but weak per her daughter, responding to commands appropriately. Diffusely tender, with mild increase over suprapubic region with no rebound, rigidity  Mild hypertension, blood pressure 156/72.  Mild tachypnea, respirations 22, otherwise unremarkable vital signs.  Afebrile, stable oxygen saturation on room air.  Lab Tests/Imaging studies: I personally interpreted labs/imaging and the pertinent results include: UA with low specific gravity, some ketones, suspect patient is driving.   Creatinine mildly elevated 1.37 from recent improvement to less than 1.  She has critical hyponatremia, sodium 119, hypochloremia, chloride 86, her creatinine is back elevated 1.37 although was improved from her admission creatinine around 2.  CBC without focal leukocytosis, mild anemia, hemoglobin 11.  [place] interpreted CT abdomen pelvis with contrast which shows multiple findings, she has some bladder wall thickening suspicious for decompression versus infection.  Her UA is unremarkable so I suspect this is from decompression.  She has some hypoattenuation in the spleen suspicious for infarct versus other, as well as some pelvic congestion on the left with engorged left gonadal vein. I agree with the radiologist interpretation.  Cardiac monitoring: EKG obtained and interpreted by myself and attending physician which shows: ***   Medications: I ordered medication including ***.  I have reviewed the patients home medicines and have made adjustments as needed.   Disposition: After consideration of the diagnostic results and the patients response to treatment, I feel that *** .   ***emergency department workup does not suggest an emergent condition requiring admission or immediate intervention beyond what has been performed at this time. The plan is: ***. The patient is safe for discharge and has been instructed to return immediately for worsening symptoms, change in symptoms or any other concerns.  Final Clinical Impression(s) / ED Diagnoses Final diagnoses:  None    Rx / DC Orders ED Discharge Orders     None

## 2024-01-08 NOTE — ED Provider Notes (Addendum)
[facility] AT [facility] Provider Note   CSN: [id number] Arrival date & time: 01/08/24  2058     History  Chief Complaint  Patient presents with   Abdominal Pain    Laura Harris is a 68 y.o. female with past medical history seen for hypertension, CKD, hyperlipidemia, elevated blood sugar who presents concern for diffuse of abdominal pain started last week which has not improved since then, admitted for recent AKI, recently discharged.  Abdominal pain never fully went away, patient with nausea, decreased p.o. intake, lightheadedness.  Nausea, headache, general weakness, could not get up by herself today.   Abdominal Pain      Home Medications Prior to Admission medications   Medication Sig Start Date End Date Taking? Authorizing Provider  acetaminophen (TYLENOL) 325 MG tablet Take 650 mg by mouth every 6 (six) hours as needed for headache or mild pain (pain score 1-3).   Yes [provider]  alendronate (FOSAMAX) 70 MG tablet Take 1 tablet (70 mg total) by mouth every 7 (seven) days. Take with a full glass of water on an empty stomach. 09/12/23  Yes Julieanne Manson, MD  amLODipine (NORVASC) 10 MG tablet TAKE 1 TABLET BY MOUTH ONCE DAILY WITH MORNING MEAL 09/12/23  Yes Julieanne Manson, MD  Calcium Citrate 250 MG TABS Take 2 tablets (500 mg total) by mouth 2 (two) times daily. 12/12/22  Yes Julieanne Manson, MD  Cholecalciferol (VITAMIN D3) 25 MCG (1000 UT) CAPS Take 1 capsule (1,000 Units total) by mouth daily. 12/12/22  Yes Julieanne Manson, MD  fluocinonide (LIDEX) 0.05 % external solution Apply to affected areas of scalp twice daily as needed. Patient taking differently: Apply 1 Application topically 2 (two) times daily as needed (for rash). Apply to affected areas of scalp 09/12/23  Yes Julieanne Manson, MD  losartan (COZAAR) 100 MG tablet Take 1 tablet (100 mg total) by mouth daily. 09/12/23  Yes Julieanne Manson, MD   metoprolol succinate (TOPROL-XL) 25 MG 24 hr tablet TAKE 1 TABLET BY MOUTH ONCE DAILY WITH EVENING MEAL 09/12/23  Yes Julieanne Manson, MD  ondansetron (ZOFRAN-ODT) 4 MG disintegrating tablet Take 1 tablet (4 mg total) by mouth every 8 (eight) hours as needed for nausea or vomiting. 01/04/24  Yes Rai, Ripudeep K, MD  traMADol (ULTRAM) 50 MG tablet Take 1 tablet (50 mg total) by mouth every 8 (eight) hours as needed for moderate pain (pain score 4-6) or severe pain (pain score 7-10). 01/04/24 01/03/25 Yes Rai, Ripudeep K, MD  triamcinolone cream (KENALOG) 0.1 % Apply to affected area twice daily as needed. Patient not taking: Reported on 01/09/2024 12/09/22   Julieanne Manson, MD      Allergies    Patient has no known allergies.    Review of Systems   Review of Systems  Gastrointestinal:  Positive for abdominal pain.  All other systems reviewed and are negative.   Physical Exam Updated Vital Signs BP (!) 155/75   Pulse (!) 59   Temp 97.8 F (36.6 C) (Oral)   Resp 13   Wt 46.7 kg   SpO2 99%   BMI 20.12 kg/m  Physical Exam Vitals and nursing note reviewed.  Constitutional:      General: She is not in acute distress.    Appearance: Normal appearance.  HENT:     Head: Normocephalic and atraumatic.  Eyes:     General:        Right eye: No discharge.  Left eye: No discharge.  Cardiovascular:     Rate and Rhythm: Normal rate and regular rhythm.     Heart sounds: No murmur heard.    No friction rub. No gallop.  Pulmonary:     Effort: Pulmonary effort is normal.     Breath sounds: Normal breath sounds.  Abdominal:     General: Bowel sounds are normal.     Palpations: Abdomen is soft.     Comments: Diffusely tender, most focally over suprapubic region with no rebound, rigidity  Skin:    General: Skin is warm and dry.     Capillary Refill: Capillary refill takes less than 2 seconds.  Neurological:     Mental Status: She is alert and oriented to person, place, and  time.     Comments: At neurologic baseline but weak per her daughter, responding to commands appropriately.  Psychiatric:        Mood and Affect: Mood normal.        Behavior: Behavior normal.     ED Results / Procedures / Treatments   Labs (all labs ordered are listed, but only abnormal results are displayed) Labs Reviewed  CBC WITH DIFFERENTIAL/PLATELET - Abnormal; Notable for the following components:      Result Value   Hemoglobin 11.0 (*)    HCT 32.3 (*)    MCV 76.2 (*)    MCH 25.9 (*)    All other components within normal limits  COMPREHENSIVE METABOLIC PANEL - Abnormal; Notable for the following components:   Sodium 119 (*)    Chloride 86 (*)    CO2 21 (*)    Glucose, Bld 143 (*)    Creatinine, Ser 1.37 (*)    Calcium 8.4 (*)    GFR, Estimated 43 (*)    All other components within normal limits  URINALYSIS, ROUTINE W REFLEX MICROSCOPIC - Abnormal; Notable for the following components:   Color, Urine STRAW (*)    Specific Gravity, Urine 1.003 (*)    Ketones, ur 5 (*)    All other components within normal limits  OSMOLALITY - Abnormal; Notable for the following components:   Osmolality 265 (*)    All other components within normal limits  OSMOLALITY, URINE - Abnormal; Notable for the following components:   Osmolality, Ur 135 (*)    All other components within normal limits  MAGNESIUM - Abnormal; Notable for the following components:   Magnesium 1.3 (*)    All other components within normal limits  BASIC METABOLIC PANEL - Abnormal; Notable for the following components:   Sodium 129 (*)    Chloride 97 (*)    CO2 21 (*)    Glucose, Bld 112 (*)    BUN 7 (*)    Creatinine, Ser 1.26 (*)    Calcium 7.7 (*)    GFR, Estimated 47 (*)    All other components within normal limits  I-STAT CHEM 8, ED - Abnormal; Notable for the following components:   Sodium 120 (*)    Chloride 88 (*)    Creatinine, Ser 1.50 (*)    Glucose, Bld 128 (*)    Calcium, Ion 0.97 (*)     Hemoglobin 10.9 (*)    HCT 32.0 (*)    All other components within normal limits  LIPASE, BLOOD  SODIUM, URINE, RANDOM    EKG None  Radiology CT Head Wo Contrast Result Date: 01/09/2024 CLINICAL DATA:  Headaches EXAM: CT HEAD WITHOUT CONTRAST TECHNIQUE: Contiguous axial images were obtained  from the base of the skull through the vertex without intravenous contrast. RADIATION DOSE REDUCTION: This exam was performed according to the departmental dose-optimization program which includes automated exposure control, adjustment of the mA and/or kV according to patient size and/or use of iterative reconstruction technique. COMPARISON:  None Available. FINDINGS: Brain: No evidence of acute infarction, hemorrhage, hydrocephalus, extra-axial collection or mass lesion/mass effect. Vascular: No hyperdense vessel or unexpected calcification. Skull: Normal. Negative for fracture or focal lesion. Sinuses/Orbits: No acute finding. Other: None. IMPRESSION: No acute intracranial abnormality noted. Electronically Signed   By: Alcide Clever M.D.   On: 01/09/2024 00:19   CT ABDOMEN PELVIS W CONTRAST Result Date: 01/08/2024 CLINICAL DATA:  Abdominal pain, acute, nonlocalized. Seen here 5 days ago for the same symptoms EXAM: CT ABDOMEN AND PELVIS WITH CONTRAST TECHNIQUE: Multidetector CT imaging of the abdomen and pelvis was performed using the standard protocol following bolus administration of intravenous contrast. RADIATION DOSE REDUCTION: This exam was performed according to the departmental dose-optimization program which includes automated exposure control, adjustment of the mA and/or kV according to patient size and/or use of iterative reconstruction technique. CONTRAST:  75mL OMNIPAQUE IOHEXOL 350 MG/ML SOLN COMPARISON:  CT without contrast 01/03/2024. FINDINGS: Lower chest: There is mosaic attenuation in the lower lobes which is probably due to air trapping and small airways disease or low inspiration. Mild  asymmetric elevation of the right hemidiaphragm is again shown. There is mild cardiomegaly. No acute lung base process. Hepatobiliary: The liver 17 cm length slightly steatotic. There is no mass enhancement. The gallbladder and bile ducts are unremarkable. Pancreas: Upper limits of normal ductal caliber. No other abnormality is seen. No mass. Spleen: There's subcapsular hypoattenuation over portions of the superior pole of the spleen, which could indicate evidence of an embolic infarct or grade 2 splenic injury with subcapsular hematoma. There are no overlying displaced rib fractures. No perisplenic hematoma is seen. Motion artifact through this area exaggerates the findings but does not fully explain the hypoattenuation. Adrenals/Urinary Tract: There is no adrenal mass. There is a 1.2 cm Bosniak 1 cyst in the midpole right kidney, Hounsfield density is 14.6. Cortical thinning is noted in both kidneys, somewhat more on the left without other focal abnormality. There is symmetric excretion of the kidneys in the delayed images. There is no urinary stone or obstruction. There is mild thickening of the bladder versus changes of underdistention. Correlate with urinalysis for possible cystitis. Stomach/Bowel: No dilatation or overt wall thickening including the appendix. Vascular/Lymphatic: There is moderate aortoiliac mixed plaque, visceral branch vessel atherosclerosis including the splenic artery. There is engorgement of left ovarian vein with left pelvic venous congestion. No adenopathy is seen. Reproductive: Small atrophic uterus.  No adnexal masses seen. Other: No abdominal wall hernia or abnormality. No abdominopelvic ascites. Musculoskeletal: There are degenerative changes of the spine. No acute or other significant osseous findings. IMPRESSION: 1. Subcapsular hypoattenuation over portions of the superior pole of the spleen, which could indicate evidence of an embolic infarct or grade 2 splenic injury with  subcapsular hematoma. There are no overlying displaced rib fractures. No perisplenic hematoma. 2. Mild hepatic steatosis. 3. Aortic and visceral branch vessel atherosclerosis. 4. Mild cardiomegaly. 5. Mosaic attenuation in the lower lobes probably due to air trapping and small airways disease or low inspiration. 6. Mild bladder thickening versus changes of underdistention. Correlate with urinalysis for possible cystitis. 7. Bosniak 1 cyst in the right kidney. No follow-up recommended. 8. Left pelvic venous congestion and engorged left gonadal  vein consistent with venous incompetence. Aortic Atherosclerosis (ICD10-I70.0). Electronically Signed   By: Almira Bar M.D.   On: 01/08/2024 23:15    Procedures .Critical Care  Performed by: Olene Floss, PA-C Authorized by: Olene Floss, PA-C   Critical care provider statement:    Critical care time (minutes):  35   Critical care was necessary to treat or prevent imminent or life-threatening deterioration of the following conditions:  Metabolic crisis   Critical care was time spent personally by me on the following activities:  Development of treatment plan with patient or surrogate, discussions with consultants, evaluation of patient's response to treatment, examination of patient, ordering and review of laboratory studies, ordering and review of radiographic studies, ordering and performing treatments and interventions, pulse oximetry, re-evaluation of patient's condition and review of old charts   Care discussed with: admitting provider       Medications Ordered in ED Medications  ondansetron (ZOFRAN-ODT) disintegrating tablet 8 mg (8 mg Oral Given 01/08/24 2149)  iohexol (OMNIPAQUE) 350 MG/ML injection 75 mL (75 mLs Intravenous Contrast Given 01/08/24 2248)  sodium chloride 0.9 % bolus 1,000 mL (0 mLs Intravenous Stopped 01/09/24 0231)  magnesium sulfate IVPB 2 g 50 mL (0 g Intravenous Stopped 01/09/24 0231)  sodium chloride 0.9 %  bolus 1,000 mL (0 mLs Intravenous Stopped 01/09/24 0355)    ED Course/ Medical Decision Making/ A&P                                 Medical Decision Making Amount and/or Complexity of Data Reviewed Labs: ordered. Radiology: ordered.  Risk Prescription drug management.   This patient is a 68 y.o. female  who presents to the ED for concern of ongoing abdominal pain, weakness, nausea.   Differential diagnoses prior to evaluation: The emergent differential diagnosis includes, but is not limited to,  CVA, spinal cord injury, ACS, arrhythmia, syncope, orthostatic hypotension, sepsis, hypoglycemia, hypoxia, electrolyte disturbance, endocrine disorder, anemia, environmental exposure, polypharmacy, The causes of generalized abdominal pain include but are not limited to AAA, mesenteric ischemia, appendicitis, diverticulitis, DKA, gastritis, gastroenteritis, AMI, nephrolithiasis, pancreatitis, peritonitis, adrenal insufficiency,lead poisoning, iron toxicity, intestinal ischemia, constipation, UTI,SBO/LBO, splenic rupture, biliary disease, IBD, IBS, PUD, or hepatitis . This is not an exhaustive differential.   Past Medical History / Co-morbidities / Social History: hypertension, CKD, hyperlipidemia, elevated blood sugar  Physical Exam: Physical exam performed. The pertinent findings include: At neurologic baseline but weak per her daughter, responding to commands appropriately. Diffusely tender, with mild increase over suprapubic region with no rebound, rigidity  Mild hypertension, blood pressure 156/72.  Mild tachypnea, respirations 22, otherwise unremarkable vital signs.  Afebrile, stable oxygen saturation on room air.  Lab Tests/Imaging studies: I personally interpreted labs/imaging and the pertinent results include: UA with low specific gravity, some ketones, suspect patient is driving.  Creatinine mildly elevated 1.37 from recent improvement to less than 1.  She has critical hyponatremia,  sodium 119, hypochloremia, chloride 86, her creatinine is back elevated 1.37 although was improved from her admission creatinine around 2.  CBC without focal leukocytosis, mild anemia, hemoglobin 11.  [place] interpreted CT abdomen pelvis with contrast which shows multiple findings, she has some bladder wall thickening suspicious for decompression versus infection.  Her UA is unremarkable so I suspect this is from decompression.  She has some hypoattenuation in the spleen suspicious for infarct versus other, as well as some pelvic congestion on the left  with engorged left gonadal vein. I agree with the radiologist interpretation.  Cardiac monitoring: EKG obtained and interpreted by myself and attending physician which shows: NSR, nonspecific T wave abnormality   Medications: I ordered medication including fluid bolus, repeat Chem-8 sodium 120, creatinine worsening at 1.5, consideration for hypertonic saline at this time given symptomatic hyponatremia.  I have reviewed the patients home medicines and have made adjustments as needed.   Consults: Spoke with critical care team who does not recommend hypertonic saline at this time, will administer another normal saline bolus, continue to reassess, threshold for emergent hypertonic's if worsening level of alertness, new seizure activity  Disposition: After consideration of the diagnostic results and the patients response to treatment, I feel that patient would benefit from critical care consult, admission, could possibly go to progressive if sodium continues to improve, mental status remains alert with no focal deficits or new seizures.  Will follow-up based on the recommendations.   After repeat metabolic panel sodium improved to 129, we will reach out to hospitalist for admission.  I spoke with Dr. Mabeline Caras who agrees to admission, but reports the morning team will have to see this patient.  Checked and patient not with worsening mental status, no signs of  developing seizure, will continue to monitor.  Final Clinical Impression(s) / ED Diagnoses Final diagnoses:  Hyponatremia    Rx / DC Orders ED Discharge Orders     None         Olene Floss, PA-C 01/09/24 0219    Olene Floss, PA-C 01/09/24 0540    Tilden Fossa, MD 01/09/24 [phone]

## 2024-01-09 ENCOUNTER — Ambulatory Visit: Payer: Medicaid Other | Admitting: Internal Medicine

## 2024-01-09 ENCOUNTER — Emergency Department (HOSPITAL_COMMUNITY): Payer: Medicaid Other

## 2024-01-09 ENCOUNTER — Other Ambulatory Visit: Payer: Self-pay

## 2024-01-09 ENCOUNTER — Other Ambulatory Visit (HOSPITAL_COMMUNITY): Payer: Medicaid Other

## 2024-01-09 DIAGNOSIS — E785 Hyperlipidemia, unspecified: Secondary | ICD-10-CM | POA: Diagnosis present

## 2024-01-09 DIAGNOSIS — R112 Nausea with vomiting, unspecified: Secondary | ICD-10-CM | POA: Diagnosis present

## 2024-01-09 DIAGNOSIS — D509 Iron deficiency anemia, unspecified: Secondary | ICD-10-CM | POA: Diagnosis present

## 2024-01-09 DIAGNOSIS — Z7983 Long term (current) use of bisphosphonates: Secondary | ICD-10-CM | POA: Diagnosis not present

## 2024-01-09 DIAGNOSIS — K5900 Constipation, unspecified: Secondary | ICD-10-CM | POA: Diagnosis present

## 2024-01-09 DIAGNOSIS — K30 Functional dyspepsia: Secondary | ICD-10-CM | POA: Diagnosis present

## 2024-01-09 DIAGNOSIS — E871 Hypo-osmolality and hyponatremia: Secondary | ICD-10-CM | POA: Diagnosis present

## 2024-01-09 DIAGNOSIS — Z79899 Other long term (current) drug therapy: Secondary | ICD-10-CM | POA: Diagnosis not present

## 2024-01-09 DIAGNOSIS — Z8349 Family history of other endocrine, nutritional and metabolic diseases: Secondary | ICD-10-CM | POA: Diagnosis not present

## 2024-01-09 DIAGNOSIS — Z841 Family history of disorders of kidney and ureter: Secondary | ICD-10-CM | POA: Diagnosis not present

## 2024-01-09 DIAGNOSIS — D649 Anemia, unspecified: Secondary | ICD-10-CM | POA: Diagnosis present

## 2024-01-09 DIAGNOSIS — Z833 Family history of diabetes mellitus: Secondary | ICD-10-CM | POA: Diagnosis not present

## 2024-01-09 DIAGNOSIS — E861 Hypovolemia: Secondary | ICD-10-CM | POA: Diagnosis present

## 2024-01-09 DIAGNOSIS — N179 Acute kidney failure, unspecified: Secondary | ICD-10-CM | POA: Diagnosis present

## 2024-01-09 DIAGNOSIS — Z818 Family history of other mental and behavioral disorders: Secondary | ICD-10-CM | POA: Diagnosis not present

## 2024-01-09 DIAGNOSIS — R7989 Other specified abnormal findings of blood chemistry: Secondary | ICD-10-CM | POA: Diagnosis present

## 2024-01-09 DIAGNOSIS — Z8249 Family history of ischemic heart disease and other diseases of the circulatory system: Secondary | ICD-10-CM | POA: Diagnosis not present

## 2024-01-09 DIAGNOSIS — I129 Hypertensive chronic kidney disease with stage 1 through stage 4 chronic kidney disease, or unspecified chronic kidney disease: Secondary | ICD-10-CM | POA: Diagnosis present

## 2024-01-09 LAB — BASIC METABOLIC PANEL
Anion gap: 11 (ref 5–15)
Anion gap: 9 (ref 5–15)
BUN: 7 mg/dL — ABNORMAL LOW (ref 8–23)
BUN: 6 mg/dL — ABNORMAL LOW (ref 8–23)
CO2: 21 mmol/L — ABNORMAL LOW (ref 22–32)
CO2: 24 mmol/L (ref 22–32)
Calcium: 7.7 mg/dL — ABNORMAL LOW (ref 8.9–10.3)
Calcium: 8.4 mg/dL — ABNORMAL LOW (ref 8.9–10.3)
Chloride: 97 mmol/L — ABNORMAL LOW (ref 98–111)
Chloride: 103 mmol/L (ref 98–111)
Creatinine, Ser: 1.26 mg/dL — ABNORMAL HIGH (ref 0.44–1.00)
Creatinine, Ser: 1.63 mg/dL — ABNORMAL HIGH (ref 0.44–1.00)
GFR, Estimated: 47 mL/min — ABNORMAL LOW (ref >60)
GFR, Estimated: 35 mL/min — ABNORMAL LOW (ref >60)
Glucose, Bld: 112 mg/dL — ABNORMAL HIGH (ref 70–99)
Glucose, Bld: 96 mg/dL (ref 70–99)
Potassium: 4.1 mmol/L (ref 3.5–5.1)
Potassium: 4.1 mmol/L (ref 3.5–5.1)
Sodium: 129 mmol/L — ABNORMAL LOW (ref 135–145)
Sodium: 136 mmol/L (ref 135–145)

## 2024-01-09 LAB — I-STAT CHEM 8, ED
BUN: 9 mg/dL (ref 8–23)
Calcium, Ion: 0.97 mmol/L — ABNORMAL LOW (ref 1.15–1.40)
Chloride: 88 mmol/L — ABNORMAL LOW (ref 98–111)
Creatinine, Ser: 1.5 mg/dL — ABNORMAL HIGH (ref 0.44–1.00)
Glucose, Bld: 128 mg/dL — ABNORMAL HIGH (ref 70–99)
HCT: 32 % — ABNORMAL LOW (ref 36.0–46.0)
Hemoglobin: 10.9 g/dL — ABNORMAL LOW (ref 12.0–15.0)
Potassium: 4.5 mmol/L (ref 3.5–5.1)
Sodium: 120 mmol/L — ABNORMAL LOW (ref 135–145)
TCO2: 25 mmol/L (ref 22–32)

## 2024-01-09 LAB — OSMOLALITY: Osmolality: 265 mosm/kg — ABNORMAL LOW (ref 275–295)

## 2024-01-09 LAB — OSMOLALITY, URINE: Osmolality, Ur: 135 mosm/kg — ABNORMAL LOW (ref 300–900)

## 2024-01-09 LAB — MAGNESIUM: Magnesium: 1.3 mg/dL — ABNORMAL LOW (ref 1.7–2.4)

## 2024-01-09 MED ORDER — METOPROLOL SUCCINATE ER 25 MG PO TB24
25.0000 mg | ORAL_TABLET | Freq: Every day | ORAL | Status: DC
  Administered 2024-01-09 – 2024-01-11 (×3): 25 mg via ORAL
  Filled 2024-01-09 (×3): qty 1

## 2024-01-09 MED ORDER — POLYETHYLENE GLYCOL 3350 17 G PO PACK
17.0000 g | PACK | Freq: Once | ORAL | Status: AC
  Administered 2024-01-09: 17 g via ORAL
  Filled 2024-01-09: qty 1

## 2024-01-09 MED ORDER — SODIUM CHLORIDE 0.9 % IV BOLUS
1000.0000 mL | Freq: Once | INTRAVENOUS | Status: AC
  Administered 2024-01-09: 1000 mL via INTRAVENOUS

## 2024-01-09 MED ORDER — ACETAMINOPHEN 650 MG RE SUPP: 650.0000 mg | Freq: Four times a day (QID) | RECTAL | Status: DC | PRN

## 2024-01-09 MED ORDER — AMLODIPINE BESYLATE 10 MG PO TABS
10.0000 mg | ORAL_TABLET | Freq: Every day | ORAL | Status: DC
  Administered 2024-01-10 – 2024-01-11 (×2): 10 mg via ORAL
  Filled 2024-01-09 (×2): qty 1
  Filled 2024-01-09: qty 2

## 2024-01-09 MED ORDER — DEXTROSE-SODIUM CHLORIDE 5-0.9 % IV SOLN: INTRAVENOUS | Status: DC

## 2024-01-09 MED ORDER — HYDROMORPHONE HCL 1 MG/ML IJ SOLN: 1.0000 mg | INTRAMUSCULAR | Status: DC | PRN

## 2024-01-09 MED ORDER — HYDRALAZINE HCL 20 MG/ML IJ SOLN: 10.0000 mg | Freq: Four times a day (QID) | INTRAMUSCULAR | Status: DC | PRN

## 2024-01-09 MED ORDER — MAGNESIUM SULFATE 2 GM/50ML IV SOLN
2.0000 g | Freq: Once | INTRAVENOUS | Status: AC
  Administered 2024-01-09: 2 g via INTRAVENOUS
  Filled 2024-01-09: qty 50

## 2024-01-09 MED ORDER — ALBUTEROL SULFATE (2.5 MG/3ML) 0.083% IN NEBU: 2.5000 mg | INHALATION_SOLUTION | Freq: Four times a day (QID) | RESPIRATORY_TRACT | Status: DC | PRN

## 2024-01-09 MED ORDER — BISACODYL 5 MG PO TBEC: 5.0000 mg | DELAYED_RELEASE_TABLET | Freq: Every day | ORAL | Status: DC | PRN

## 2024-01-09 MED ORDER — ENOXAPARIN SODIUM 40 MG/0.4ML IJ SOSY
40.0000 mg | PREFILLED_SYRINGE | INTRAMUSCULAR | Status: DC
  Administered 2024-01-10 – 2024-01-11 (×2): 40 mg via SUBCUTANEOUS
  Filled 2024-01-09 (×2): qty 0.4

## 2024-01-09 MED ORDER — ACETAMINOPHEN 325 MG PO TABS
650.0000 mg | ORAL_TABLET | Freq: Four times a day (QID) | ORAL | Status: DC | PRN
  Administered 2024-01-11: 650 mg via ORAL
  Filled 2024-01-09: qty 2

## 2024-01-09 MED ORDER — POLYETHYLENE GLYCOL 3350 17 G PO PACK: 17.0000 g | PACK | Freq: Every day | ORAL | Status: DC | PRN

## 2024-01-09 NOTE — H&P (Signed)
[facility] History and Physical  Franklin Cl[place]addle ZOX:[id number] DOB: [birth year] DOA: 01/08/2024 PCP: Julieanne Manson, MD  Presented from: Home Chief Complaint: Abdominal pain  History of Present Illness: Laura Harris is a 68 y.o. female with PMH significant for prediabetes, HTN, HLD, anemia 2/17, patient presented to the ED with complaint of abdominal pain that started last week.   Patient recently returned from a trip to [place] where she was visiting her family for about 3 months. Few days after she returned, patient started complaining of abdominal pain, nausea, vomiting, poor oral intake.   She was hospitalized 2/11 to 2/13 with this complaint.  Workup showed elevated creatinine to 2.15, lipase normal, noncontrast CT abdomen was normal.  She was admitted for AKI, IV hydration was given.  Renal ultrasound was unremarkable.  Creatinine improved back to baseline and was hence discharged home. It seems abdominal pain initially improved but later it worsened again and hence returned back to ED after 4 days of discharge.    In the ED, afebrile, hemodynamically stable, breathing on room air. Initial blood work with WC count normal, hemoglobin low at 11 with MCV low at 76. Serum sodium level was significantly low at 119, BUN/creatinine 8/1.37, magnesium low at 1.3 Serum osmolality low at 265, urine osmolality low at 135  CT abdomen pelvis with contrast showed subcapsular hypoattenuation over portions of the superior pole of the spleen, which could indicate evidence of an embolic infarct or grade 2 splenic injury with subcapsular hematoma. There are no overlying displaced rib fractures. No perisplenic hematoma.  EDP consulted PCCM.  Isotonic fluid was suggested, did not feel the need of ICU admission. Patient received about 2 L of normal saline.  Repeat sodium level after 6 hours of the initial blood work showed an improvement to 129.  Creatinine slightly better at 1.26. Hospital service was  consulted for inpatient management.  I received this patient as a carryover admission from last night. At the time of my evaluation this morning patient was lying down in bed.  Her daughter was at bedside and helped with interpretation.  Patient was alert, awake, able to have minimal conversation with her daughter. History reviewed and detailed as above.  Review of Systems:  All systems were reviewed and were negative unless otherwise mentioned in the HPI   Past medical history: Past Medical History:  Diagnosis Date   Bilateral knee pain    CKD (chronic kidney disease) 2017   Hyperlipidemia 10/18/2020   Hypertension     Past surgical history: Past Surgical History:  Procedure Laterality Date   NO PAST SURGERIES      Social History:  reports that she has never smoked. She has never used smokeless tobacco. She reports that she does not drink alcohol and does not use drugs.  Allergies:  No Known Allergies Patient has no known allergies.   Family history:  Family History  Problem Relation Age of Onset   GER disease Mother    Migraines Father    Thyroid disease Sister        describe a very large goiter   Migraines Brother    Hypertension Brother    Kidney disease Brother    Mental illness Brother        He disappeared--do not know if alive   Diabetes Daughter    Anemia Daughter      Physical Exam: Vitals:   01/09/24 1009 01/09/24 1012 01/09/24 1013 01/09/24 1015  BP:      Pulse: Marland Kitchen)  54 (!) 54 61 (!) 58  Resp: 10 11 16 11   Temp:      TempSrc:      SpO2: 100% 100% 100% 100%  Weight:       Wt Readings from Last 3 Encounters:  01/08/24 46.7 kg  09/12/23 46.7 kg  12/09/22 47.2 kg   Body mass index is 20.12 kg/m.  General exam: Pleasant, elderly female of [place] origin Skin: No rashes, lesions or ulcers. HEENT: Atraumatic, normocephalic, no obvious bleeding Lungs: Clear to auscultation bilaterally,  CVS: S1, S2, no murmur,   GI/Abd: Soft, only  mild tenderness elicited in the lower abdomen.  No tenderness on left upper quadrant., nondistended, bowel sound present,  CNS: Alert, awake, oriented x 3 Psychiatry: Mood appropriate,  Extremities: No pedal edema, no calf tenderness,    ----------------------------------------------------------------------------------------------------------------------------------------- ----------------------------------------------------------------------------------------------------------------------------------------- -----------------------------------------------------------------------------------------------------------------------------------------  Assessment/Plan: Principal Problem:   Hyponatremia  Persistent abdominal pain Splenic infarct versus splenic injury Presented with abdominal pain for last 10 days.  It seems pain is started after her return from [place] last week.  Noncontrast CT scan done on 2/12 did not show any abnormality.  However pain persisted and hence returned to ED. CT abdomen/pelvis with contrast last night showed subcapsular hypoattenuation over portions of the superior pole of the spleen, which could indicate evidence of an embolic infarct or grade 2 splenic injury with subcapsular hematoma.  No history of trauma that the daughter is aware of On abdominal exam, she has mild tenderness in the lower abdomen, no tenderness in left upper quadrant.  Reports no bowel movement in 3 days. Hemoglobin is stable.  Not sure if any surgical intervention would be required. I did a curbside consultation with trauma surgeon Dr. Janee Morn.  He reviewed both the CT scans.  She has no clear history of trauma.  There is no significant tenderness in the hemoglobin stable.  No need of surgical intervention at this time.  Will monitor hemoglobin again.  If hemoglobin drifts down or tenderness worsens, consider formal consultation to trauma surgery. CT scan also shows moderate stool burden.   Constipation may be contributing to her symptoms.  Will give 1 dose of MiraLAX. Only Tylenol for pain for now.  Acute hyponatremia Sodium level last was normal. Presented with acutely low sodium level of 119.  Serum osmolality and urine osmolality are also low but urine sodium level is pending. Hypovolemic hyponatremia versus SIADH related to pain. Was given 2 L of fluid with improvement in sodium level to 129.  Repeat labs at noon. Recent Labs  Lab 01/02/24 1929 01/03/24 0410 01/04/24 0417 01/08/24 2149 01/09/24 0051 01/09/24 0400  NA 136 136 138 119* 120* 129*   AKI Recently hospitalized for AKI with creatinine elevated to 2.15.  It improved with IV hydration.  Presented again with AKI with creatinine elevated to 1.5.  Related to poor oral intake. Improving with IV fluid. Recent Labs    09/13/23 1026 01/02/24 1929 01/03/24 0410 01/04/24 0417 01/08/24 2149 01/09/24 0051 01/09/24 0400  BUN 19 30* 26* 16 8 9  7*  CREATININE 1.01* 2.15* 1.55* 0.98 1.37* 1.50* 1.26*   Hypertension PTA meds- Toprol 25 mg daily, amlodipine 10 mg daily, losartan 100 mg daily. Resume Toprol.  Keep amlodipine and losartan on hold for now.  Chronic microcytic anemia Ferritin level was over 100 recently. Hemoglobin is stable. Recent Labs    01/03/24 0410 01/03/24 0807 01/03/24 0830 01/04/24 0417 01/08/24 2149 01/09/24 0051  HGB 9.2* 9.3*  --  9.7* 11.0*  10.9*  MCV 83.9  --   --  79.9* 76.2*  --   FERRITIN  --   --  111  --   --   --   TIBC  --   --  235*  --   --   --   IRON  --   --  31  --   --   --    Mobility: Encourage ambulation  Goals of care:   Code Status: Full Code    DVT prophylaxis:  enoxaparin (LOVENOX) injection 40 mg Start: 01/10/24 0800   Antimicrobials: None Fluid: D5 NS at 75 mL/h Consultants: Curbside consultation with trauma surgery Family Communication: Daughter at bedside  Dispo: The patient is from: Home              Anticipated d/c is to: Hopefully  home in 1 to 2 days  Diet: Diet Order             Diet regular Room service appropriate? Yes; Fluid consistency: Thin  Diet effective now                    ------------------------------------------------------------------------------------- Severity of Illness: The appropriate patient status for this patient is INPATIENT. Inpatient status is judged to be reasonable and necessary in order to provide the required intensity of service to ensure the patient's safety. The patient's presenting symptoms, physical exam findings, and initial radiographic and laboratory data in the context of their chronic comorbidities is felt to place them at high risk for further clinical deterioration. Furthermore, it is not anticipated that the patient will be medically stable for discharge from the hospital within 2 midnights of admission.   * I certify that at the point of admission it is my clinical judgment that the patient will require inpatient hospital care spanning beyond 2 midnights from the point of admission due to high intensity of service, high risk for further deterioration and high frequency of surveillance required.* -------------------------------------------------------------------------------------   Home Meds: Prior to Admission medications   Medication Sig Start Date End Date Taking? Authorizing Provider  acetaminophen (TYLENOL) 325 MG tablet Take 650 mg by mouth every 6 (six) hours as needed for headache or mild pain (pain score 1-3).   Yes [provider]  alendronate (FOSAMAX) 70 MG tablet Take 1 tablet (70 mg total) by mouth every 7 (seven) days. Take with a full glass of water on an empty stomach. 09/12/23  Yes Julieanne Manson, MD  amLODipine (NORVASC) 10 MG tablet TAKE 1 TABLET BY MOUTH ONCE DAILY WITH MORNING MEAL 09/12/23  Yes Julieanne Manson, MD  Calcium Citrate 250 MG TABS Take 2 tablets (500 mg total) by mouth 2 (two) times daily. 12/12/22  Yes Julieanne Manson, MD  Cholecalciferol (VITAMIN D3) 25 MCG (1000 UT) CAPS Take 1 capsule (1,000 Units total) by mouth daily. 12/12/22  Yes Julieanne Manson, MD  fluocinonide (LIDEX) 0.05 % external solution Apply to affected areas of scalp twice daily as needed. Patient taking differently: Apply 1 Application topically 2 (two) times daily as needed (for rash). Apply to affected areas of scalp 09/12/23  Yes Julieanne Manson, MD  losartan (COZAAR) 100 MG tablet Take 1 tablet (100 mg total) by mouth daily. 09/12/23  Yes Julieanne Manson, MD  metoprolol succinate (TOPROL-XL) 25 MG 24 hr tablet TAKE 1 TABLET BY MOUTH ONCE DAILY WITH EVENING MEAL 09/12/23  Yes Julieanne Manson, MD  ondansetron (ZOFRAN-ODT) 4 MG disintegrating tablet Take 1 tablet (4 mg total) by  mouth every 8 (eight) hours as needed for nausea or vomiting. 01/04/24  Yes Rai, Ripudeep K, MD  traMADol (ULTRAM) 50 MG tablet Take 1 tablet (50 mg total) by mouth every 8 (eight) hours as needed for moderate pain (pain score 4-6) or severe pain (pain score 7-10). 01/04/24 01/03/25 Yes Rai, Ripudeep K, MD  triamcinolone cream (KENALOG) 0.1 % Apply to affected area twice daily as needed. Patient not taking: Reported on 01/09/2024 12/09/22   Julieanne Manson, MD    Labs on Admission:   CBC: Recent Labs  Lab 01/02/24 1929 01/03/24 0410 01/03/24 0807 01/04/24 0417 01/08/24 2149 01/09/24 0051  WBC 7.0 4.8  --  4.1 7.0  --   NEUTROABS 5.6 3.3  --   --  5.4  --   HGB 10.2* 9.2* 9.3* 9.7* 11.0* 10.9*  HCT 31.6* 29.6* 29.6* 29.9* 32.3* 32.0*  MCV 81.9 83.9  --  79.9* 76.2*  --   PLT 164 116*  --  129* 195  --     Basic Metabolic Panel: Recent Labs  Lab 01/02/24 1929 01/03/24 0410 01/04/24 0417 01/08/24 2149 01/08/24 2353 01/09/24 0051 01/09/24 0400  NA 136 136 138 119*  --  120* 129*  K 4.3 4.3 4.1 3.7  --  4.5 4.1  CL 101 109 107 86*  --  88* 97*  CO2 23 19* 24 21*  --   --  21*  GLUCOSE 136* 85 82 143*  --  128* 112*  BUN  30* 26* 16 8  --  9 7*  CREATININE 2.15* 1.55* 0.98 1.37*  --  1.50* 1.26*  CALCIUM 8.5* 7.6* 8.5* 8.4*  --   --  7.7*  MG  --  1.8  --   --  1.3*  --   --     Liver Function Tests: Recent Labs  Lab 01/02/24 1929 01/03/24 0410 01/08/24 2149  AST 21 19 22   ALT 15 12 13   ALKPHOS 36* 30* 39  BILITOT 1.1 0.9 0.9  PROT 6.6 5.8* 7.1  ALBUMIN 3.5 3.1* 3.9   Recent Labs  Lab 01/02/24 1929 01/08/24 2149  LIPASE 40 48   No results for input(s): "AMMONIA" in the last 168 hours.  Cardiac Enzymes: No results for input(s): "CKTOTAL", "CKMB", "CKMBINDEX", "TROPONINI" in the last 168 hours.  BNP (last 3 results) No results for input(s): "BNP" in the last 8760 hours.  ProBNP (last 3 results) No results for input(s): "PROBNP" in the last 8760 hours.  CBG: No results for input(s): "GLUCAP" in the last 168 hours.  Lipase     Component Value Date/Time   LIPASE 48 01/08/2024 2149     Urinalysis    Component Value Date/Time   COLORURINE STRAW (A) 01/08/2024 2141   APPEARANCEUR CLEAR 01/08/2024 2141   LABSPEC 1.003 (L) 01/08/2024 2141   PHURINE 6.0 01/08/2024 2141   GLUCOSEU NEGATIVE 01/08/2024 2141   HGBUR NEGATIVE 01/08/2024 2141   BILIRUBINUR NEGATIVE 01/08/2024 2141   BILIRUBINUR negative 09/10/2021 1018   KETONESUR 5 (A) 01/08/2024 2141   PROTEINUR NEGATIVE 01/08/2024 2141   UROBILINOGEN 0.2 09/10/2021 1018   NITRITE NEGATIVE 01/08/2024 2141   LEUKOCYTESUR NEGATIVE 01/08/2024 2141     Drugs of Abuse  No results found for: "LABOPIA", "COCAINSCRNUR", "LABBENZ", "AMPHETMU", "THCU", "LABBARB"    Radiological Exams on Admission: CT Head Wo Contrast Result Date: 01/09/2024 CLINICAL DATA:  Headaches EXAM: CT HEAD WITHOUT CONTRAST TECHNIQUE: Contiguous axial images were obtained from the base of the skull  through the vertex without intravenous contrast. RADIATION DOSE REDUCTION: This exam was performed according to the departmental dose-optimization program which includes  automated exposure control, adjustment of the mA and/or kV according to patient size and/or use of iterative reconstruction technique. COMPARISON:  None Available. FINDINGS: Brain: No evidence of acute infarction, hemorrhage, hydrocephalus, extra-axial collection or mass lesion/mass effect. Vascular: No hyperdense vessel or unexpected calcification. Skull: Normal. Negative for fracture or focal lesion. Sinuses/Orbits: No acute finding. Other: None. IMPRESSION: No acute intracranial abnormality noted. Electronically Signed   By: Alcide Clever M.D.   On: 01/09/2024 00:19   CT ABDOMEN PELVIS W CONTRAST Result Date: 01/08/2024 CLINICAL DATA:  Abdominal pain, acute, nonlocalized. Seen here 5 days ago for the same symptoms EXAM: CT ABDOMEN AND PELVIS WITH CONTRAST TECHNIQUE: Multidetector CT imaging of the abdomen and pelvis was performed using the standard protocol following bolus administration of intravenous contrast. RADIATION DOSE REDUCTION: This exam was performed according to the departmental dose-optimization program which includes automated exposure control, adjustment of the mA and/or kV according to patient size and/or use of iterative reconstruction technique. CONTRAST:  75mL OMNIPAQUE IOHEXOL 350 MG/ML SOLN COMPARISON:  CT without contrast 01/03/2024. FINDINGS: Lower chest: There is mosaic attenuation in the lower lobes which is probably due to air trapping and small airways disease or low inspiration. Mild asymmetric elevation of the right hemidiaphragm is again shown. There is mild cardiomegaly. No acute lung base process. Hepatobiliary: The liver 17 cm length slightly steatotic. There is no mass enhancement. The gallbladder and bile ducts are unremarkable. Pancreas: Upper limits of normal ductal caliber. No other abnormality is seen. No mass. Spleen: There's subcapsular hypoattenuation over portions of the superior pole of the spleen, which could indicate evidence of an embolic infarct or grade 2 splenic  injury with subcapsular hematoma. There are no overlying displaced rib fractures. No perisplenic hematoma is seen. Motion artifact through this area exaggerates the findings but does not fully explain the hypoattenuation. Adrenals/Urinary Tract: There is no adrenal mass. There is a 1.2 cm Bosniak 1 cyst in the midpole right kidney, Hounsfield density is 14.6. Cortical thinning is noted in both kidneys, somewhat more on the left without other focal abnormality. There is symmetric excretion of the kidneys in the delayed images. There is no urinary stone or obstruction. There is mild thickening of the bladder versus changes of underdistention. Correlate with urinalysis for possible cystitis. Stomach/Bowel: No dilatation or overt wall thickening including the appendix. Vascular/Lymphatic: There is moderate aortoiliac mixed plaque, visceral branch vessel atherosclerosis including the splenic artery. There is engorgement of left ovarian vein with left pelvic venous congestion. No adenopathy is seen. Reproductive: Small atrophic uterus.  No adnexal masses seen. Other: No abdominal wall hernia or abnormality. No abdominopelvic ascites. Musculoskeletal: There are degenerative changes of the spine. No acute or other significant osseous findings. IMPRESSION: 1. Subcapsular hypoattenuation over portions of the superior pole of the spleen, which could indicate evidence of an embolic infarct or grade 2 splenic injury with subcapsular hematoma. There are no overlying displaced rib fractures. No perisplenic hematoma. 2. Mild hepatic steatosis. 3. Aortic and visceral branch vessel atherosclerosis. 4. Mild cardiomegaly. 5. Mosaic attenuation in the lower lobes probably due to air trapping and small airways disease or low inspiration. 6. Mild bladder thickening versus changes of underdistention. Correlate with urinalysis for possible cystitis. 7. Bosniak 1 cyst in the right kidney. No follow-up recommended. 8. Left pelvic venous  congestion and engorged left gonadal vein consistent with venous incompetence.  Aortic Atherosclerosis (ICD10-I70.0). Electronically Signed   By: Almira Bar M.D.   On: 01/08/2024 23:15     Signed, Lorin Glass, MD [facility] 01/09/2024

## 2024-01-09 NOTE — Plan of Care (Signed)

## 2024-01-10 ENCOUNTER — Other Ambulatory Visit: Payer: Self-pay

## 2024-01-10 DIAGNOSIS — E871 Hypo-osmolality and hyponatremia: Secondary | ICD-10-CM | POA: Diagnosis not present

## 2024-01-10 LAB — HIV ANTIBODY (ROUTINE TESTING W REFLEX): HIV Screen 4th Generation wRfx: NONREACTIVE

## 2024-01-10 LAB — CBC
HCT: 27.8 % — ABNORMAL LOW (ref 36.0–46.0)
Hemoglobin: 9.1 g/dL — ABNORMAL LOW (ref 12.0–15.0)
MCH: 25.8 pg — ABNORMAL LOW (ref 26.0–34.0)
MCHC: 32.7 g/dL (ref 30.0–36.0)
MCV: 78.8 fL — ABNORMAL LOW (ref 80.0–100.0)
Platelets: 162 10*3/uL (ref 150–400)
RBC: 3.53 MIL/uL — ABNORMAL LOW (ref 3.87–5.11)
RDW: 13 % (ref 11.5–15.5)
WBC: 3.3 10*3/uL — ABNORMAL LOW (ref 4.0–10.5)
nRBC: 0 % (ref 0.0–0.2)

## 2024-01-10 LAB — BASIC METABOLIC PANEL
Anion gap: 8 (ref 5–15)
BUN: 9 mg/dL (ref 8–23)
CO2: 23 mmol/L (ref 22–32)
Calcium: 8.1 mg/dL — ABNORMAL LOW (ref 8.9–10.3)
Chloride: 106 mmol/L (ref 98–111)
Creatinine, Ser: 1.16 mg/dL — ABNORMAL HIGH (ref 0.44–1.00)
GFR, Estimated: 52 mL/min — ABNORMAL LOW (ref >60)
Glucose, Bld: 104 mg/dL — ABNORMAL HIGH (ref 70–99)
Potassium: 4.2 mmol/L (ref 3.5–5.1)
Sodium: 137 mmol/L (ref 135–145)

## 2024-01-10 MED ORDER — DEXTROSE-SODIUM CHLORIDE 5-0.9 % IV SOLN: INTRAVENOUS | Status: AC

## 2024-01-10 MED ORDER — MAGNESIUM HYDROXIDE 400 MG/5ML PO SUSP
30.0000 mL | Freq: Once | ORAL | Status: AC
  Administered 2024-01-10: 30 mL via ORAL
  Filled 2024-01-10: qty 30

## 2024-01-10 NOTE — Plan of Care (Signed)
  Problem: Activity: Goal: Risk for activity intolerance will decrease Outcome: Progressing   Problem: Nutrition: Goal: Adequate nutrition will be maintained Outcome: Progressing   Problem: Elimination: Goal: Will not experience complications related to bowel motility Outcome: Progressing Goal: Will not experience complications related to urinary retention Outcome: Progressing   Problem: Skin Integrity: Goal: Risk for impaired skin integrity will decrease Outcome: Progressing   

## 2024-01-10 NOTE — Progress Notes (Signed)
PROGRESS NOTE  Laura Harris  DOB: 1956/08/24  PCP: Julieanne Manson, MD ZOX:[id number]  DOA: 01/08/2024  LOS: 1 day  Hospital Day: 3  Brief narrative: Laura Harris is a 68 y.o. female with PMH significant for prediabetes, HTN, HLD, anemia 2/17, patient presented to the ED with complaint of abdominal pain that started last week.   Patient recently returned from a trip to [place] where she was visiting her family for about 3 months. Few days after she returned, patient started complaining of abdominal pain, nausea, vomiting, poor oral intake.   She was hospitalized 2/11 to 2/13 with this complaint.  Workup showed elevated creatinine to 2.15, lipase normal, noncontrast CT abdomen was normal.  She was admitted for AKI, IV hydration was given.  Renal ultrasound was unremarkable.  Creatinine improved back to baseline and was hence discharged home. It seems abdominal pain initially improved but later it worsened again and hence returned back to ED after 4 days of discharge.    In the ED, afebrile, hemodynamically stable, breathing on room air. Initial blood work with WC count normal, hemoglobin low at 11 with MCV low at 76. Serum sodium level was significantly low at 119, BUN/creatinine 8/1.37, magnesium low at 1.3 Serum osmolality low at 265, urine osmolality low at 135  CT abdomen pelvis with contrast showed subcapsular hypoattenuation over portions of the superior pole of the spleen, which could indicate evidence of an embolic infarct or grade 2 splenic injury with subcapsular hematoma. There are no overlying displaced rib fractures. No perisplenic hematoma.  EDP consulted PCCM.  Isotonic fluid was suggested, did not feel the need of ICU admission. Patient received about 2 L of normal saline.  Repeat sodium level after 6 hours of the initial blood work showed an improvement to 129.  Creatinine slightly better at 1.26. Admitted to [facility]  Subjective: Patient was seen and examined this morning.   Elderly female of [place] origin. At 2 movements in last 24 hours.  Continues to have dull aching abdominal pain. Has not used any pain medicine since yesterday. Daughter at bedside. Patient and daughter request 1 more day with IV fluid as they are afraid of readmission third time  Assessment/Plan: Principal Problem:   Hyponatremia  Persistent abdominal pain Splenic infarct versus splenic injury Presented with abdominal pain for last 10 days.  It seems pain is started after her return from [place] last week.  Noncontrast CT scan done on 2/12 did not show any abnormality.  However pain persisted and hence returned to ED. CT abdomen/pelvis with contrast last night showed subcapsular hypoattenuation over portions of the superior pole of the spleen, which could indicate evidence of an embolic infarct or grade 2 splenic injury with subcapsular hematoma.  No history of trauma that the daughter is aware of On abdominal exam, she has mild tenderness in the lower abdomen, no tenderness in left upper quadrant.  Reports no bowel movement in 3 days. Hemoglobin is stable.   I did a curbside consultation with trauma surgeon Dr. Janee Morn.  He reviewed both the CT scans.  She has no clear history of trauma.  There is no significant tenderness in the hemoglobin stable.  No need of surgical intervention at this time.  Tenderness that has not worsened.    Constipation CT scan also shows moderate stool burden.   Constipation could have been contributing to her symptoms.  MiraLAX 1 dose was given. Only Tylenol for pain for now.  Acute hyponatremia Sodium level last week was normal.  Presented with acutely low sodium level of 119.  Serum osmolality and urine osmolality are also low but urine sodium level is pending.??  Not collected Hypovolemic hyponatremia versus SIADH related to pain. With IV hydration, creatinine improved Recent Labs  Lab 01/04/24 0417 01/08/24 2149 01/09/24 0051 01/09/24 0400  01/09/24 1215 01/10/24 0448  NA 138 119* 120* 129* 136 137   AKI Creatinine improving with IV fluid Recent Labs    09/13/23 1026 01/02/24 1929 01/03/24 0410 01/04/24 0417 01/08/24 2149 01/09/24 0051 01/09/24 0400 01/09/24 1215 01/10/24 0448  BUN 19 30* 26* 16 8 9  7* 6* 9  CREATININE 1.01* 2.15* 1.55* 0.98 1.37* 1.50* 1.26* 1.63* 1.16*   Hypertension PTA meds- Toprol 25 mg daily, amlodipine 10 mg daily, losartan 100 mg daily. Currently on Toprol only.  Amlodipine and losartan on hold.  Chronic microcytic anemia Ferritin level was over 100 recently. Hemoglobin slightly lower today than yesterday.  Likely vasodilation.  Continue to monitor Recent Labs    01/03/24 0807 01/03/24 0830 01/04/24 0417 01/08/24 2149 01/09/24 0051 01/10/24 0448  HGB 9.3*  --  9.7* 11.0* 10.9* 9.1*  MCV  --   --  79.9* 76.2*  --  78.8*  FERRITIN  --  111  --   --   --   --   TIBC  --  235*  --   --   --   --   IRON  --  31  --   --   --   --    Mobility: Encourage ambulation  Goals of care:   Code Status: Full Code    DVT prophylaxis:  enoxaparin (LOVENOX) injection 40 mg Start: 01/10/24 0800   Antimicrobials: None Fluid: D5 NS at 75 mL/h Consultants: Curbside consultation with trauma surgery Family Communication: Daughter at bedside  Status: Inpatient Level of care:  Med-Surg   Patient is from: Home Needs to continue in-hospital care: On IV fluid Anticipated d/c to: Hopefully home tomorrow   Diet:  Diet Order             Diet regular Room service appropriate? Yes; Fluid consistency: Thin  Diet effective now                   Scheduled Meds:  amLODipine  10 mg Oral Daily   enoxaparin (LOVENOX) injection  40 mg Subcutaneous Q24H   metoprolol succinate  25 mg Oral Daily    PRN meds: acetaminophen **OR** acetaminophen, albuterol, bisacodyl, hydrALAZINE, polyethylene glycol   Infusions:   dextrose 5 % and 0.9 % NaCl 75 mL/hr at 01/10/24 1125     Antimicrobials: Anti-infectives (From admission, onward)    None       Objective: Vitals:   01/10/24 0746 01/10/24 0950  BP: (!) 144/71 (!) 146/66  Pulse: 61 64  Resp: 18   Temp: 98.5 F (36.9 C)   SpO2: 100%     Intake/Output Summary (Last 24 hours) at 01/10/2024 1457 Last data filed at 01/10/2024 1300 Gross per 24 hour  Intake 2208.75 ml  Output --  Net 2208.75 ml   Filed Weights   01/08/24 2117  Weight: 46.7 kg   Weight change:  Body mass index is 20.12 kg/m.   Physical Exam:  General exam: Pleasant, elderly female of [place] origin Skin: No rashes, lesions or ulcers. HEENT: Atraumatic, normocephalic, no obvious bleeding Lungs: Clear to auscultation bilaterally,  CVS: S1, S2, no murmur,   GI/Abd: Soft, only mild tenderness elicited in the  lower abdomen.  No tenderness on left upper quadrant., nondistended, bowel sound present,  CNS: Alert, awake, oriented x 3 Psychiatry: Mood appropriate,  Extremities: No pedal edema, no calf tenderness,   Data Review: I have personally reviewed the laboratory data and studies available.  F/u labs  Unresulted Labs (From admission, onward)     Start     Ordered   01/09/24 0026  Sodium, urine, random  Add-on,   AD        01/09/24 0025           Total time spent in review of labs and imaging, patient evaluation, formulation of plan, documentation and communication with family: 45 minutes  Signed, Lorin Glass, MD [facility] 01/10/2024

## 2024-01-11 DIAGNOSIS — E871 Hypo-osmolality and hyponatremia: Secondary | ICD-10-CM | POA: Diagnosis not present

## 2024-01-11 MED ORDER — PANTOPRAZOLE SODIUM 40 MG PO TBEC
40.0000 mg | DELAYED_RELEASE_TABLET | Freq: Every day | ORAL | Status: DC
  Administered 2024-01-11: 40 mg via ORAL
  Filled 2024-01-11: qty 1

## 2024-01-11 MED ORDER — PANTOPRAZOLE SODIUM 40 MG PO TBEC: 40.0000 mg | DELAYED_RELEASE_TABLET | Freq: Every day | ORAL | 2 refills | Status: DC

## 2024-01-11 NOTE — Discharge Summary (Signed)
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  Outpatient referral for GI given. For blood pressure, continue Toprol and amlodipine.  Losartan has been on hold because of impaired kidney function.   Follow-up with PCP before reinitiation   Brief narrative: Laura Harris is a 68 y.o. female with PMH significant for prediabetes, HTN, HLD, anemia 2/17, patient presented to the ED with complaint of abdominal pain that started last week.   Patient recently returned from a trip to [place] where she was visiting her family for about 3 months. Few days after she returned, patient started complaining of abdominal pain, nausea, vomiting, poor oral intake.   She was hospitalized 2/11 to 2/13 with this complaint.  Workup showed elevated creatinine to 2.15, lipase normal, noncontrast CT abdomen was normal.  She was admitted for AKI, IV hydration was given.  Renal ultrasound was unremarkable.  Creatinine improved back to baseline and was hence discharged home. It seems abdominal pain initially improved but later it worsened again and hence returned back to ED after 4 days of discharge.    In the ED, afebrile, hemodynamically stable, breathing on room air. Initial blood work with WC count normal, hemoglobin low at 11 with MCV low at 76. Serum sodium level was significantly low at 119, BUN/creatinine 8/1.37, magnesium low at 1.3 Serum osmolality low at 265, urine osmolality low at 135  CT abdomen pelvis with contrast showed subcapsular hypoattenuation over portions of the superior pole of the spleen, which could indicate evidence of an embolic infarct or grade 2 splenic injury with subcapsular hematoma. There are no overlying displaced rib fractures. No  perisplenic hematoma.  EDP consulted PCCM.  Isotonic fluid was suggested, did not feel the need of ICU admission. Patient received about 2 L of normal saline.  Repeat sodium level after 6 hours of the initial blood work showed an improvement to 129.  Creatinine slightly better at 1.26. Admitted to [facility]  Subjective: Patient was seen and examined this morning.  Elderly female of [place] origin. Abdomen pain improving.  He started on Protonix this morning. Adequately hydrated. Feels ready for discharge home today. Daughter at bedside.  Plan revised with daughter.  Assessment/Plan: Principal Problem:   Hyponatremia  Persistent abdominal pain Splenic infarct versus splenic injury Presented with abdominal pain for last 10 days.  It seems pain is started after her return from [place] last week.  Noncontrast CT scan done on 2/12 did not show any abnormality.  However pain persisted and hence returned to ED. CT abdomen/pelvis with contrast last night showed subcapsular hypoattenuation over portions of the superior pole of the spleen, which could indicate evidence of an embolic infarct or grade 2 splenic injury with subcapsular hematoma.  No history of trauma that the daughter is aware of On abdominal exam, she did not have any tenderness in left upper quadrant.  Had some tenderness in the lower abdomen.  Reports no bowel movement in 3 days at the time of presentation. Hemoglobin is stable.   I did a curbside consultation with trauma surgeon Dr. Janee Morn.  He reviewed both the CT scans.  She has no clear history of trauma.  There is no significant tenderness in the hemoglobin stable.  No need of surgical intervention at this time.  Tenderness has not worsened.  Stable for discharge home today.  Constipation CT scan also shows moderate stool burden.   Constipation could have been contributing to her symptoms.  MiraLAX 1 dose was given with 2 bowel movements subsequently.  Feels  better.  Functional dyspepsia Chronic microcytic anemia And has intermittent epigastric pain and loss of appetite.  No alarming symptoms to consider urgent endoscopy. Hemoglobin low between 9 and 10 but ferritin level adequate at 111. Started on Protonix. Outpatient referral to GI given. Recent Labs    01/03/24 0807 01/03/24 0830 01/04/24 0417 01/08/24 2149 01/09/24 0051 01/10/24 0448  HGB 9.3*  --  9.7* 11.0* 10.9* 9.1*  MCV  --   --  79.9* 76.2*  --  78.8*  FERRITIN  --  111  --   --   --   --   TIBC  --  235*  --   --   --   --   IRON  --  31  --   --   --   --    Acute hypovolemic hyponatremia Sodium level last week was normal. Presented with acutely low sodium level of 119.  Serum osmolality and urine osmolality low.   With IV hydration, creatinine improved Recent Labs  Lab 01/08/24 2149 01/09/24 0051 01/09/24 0400 01/09/24 1215 01/10/24 0448  NA 119* 120* 129* 136 137   AKI Creatinine improving with IV fluid Recent Labs    09/13/23 1026 01/02/24 1929 01/03/24 0410 01/04/24 0417 01/08/24 2149 01/09/24 0051 01/09/24 0400 01/09/24 1215 01/10/24 0448  BUN 19 30* 26* 16 8 9  7* 6* 9  CREATININE 1.01* 2.15* 1.55* 0.98 1.37* 1.50* 1.26* 1.63* 1.16*   Hypertension PTA meds- Toprol 25 mg daily, amlodipine 10 mg daily, losartan 100 mg daily. Currently on Toprol only.  Blood pressure uptrending.  Resume amlodipine post discharge.  Keep losartan on hold. Follow-up with PCP as an outpatient  Goals of care:   Code Status: Full Code    Diet:  Diet Order             Diet general           Diet regular Room service appropriate? Yes; Fluid consistency: Thin  Diet effective now                   Nutritional status:  Body mass index is 20.12 kg/m.       Wounds:  -    Discharge Exam:   Vitals:   01/10/24 2025 01/11/24 0546 01/11/24 0754 01/11/24 1013  BP: 136/78 (!) 166/81 (!) 162/68 (!) 147/73  Pulse: 69 72 64 83  Resp: 17 18 19 17   Temp:  98.2 F (36.8 C) 98.4 F (36.9 C) 99.4 F (37.4 C) 98.6 F (37 C)  TempSrc: Oral Oral Oral Oral  SpO2: 100% 99% 98% 98%  Weight:        Body mass index is 20.12 kg/m.  General exam: Pleasant, elderly female of [place] origin. Skin: No rashes, lesions or ulcers. HEENT: Atraumatic, normocephalic, no obvious bleeding Lungs: Clear to auscultation bilaterally,  CVS: S1, S2, no murmur,   GI/Abd: Soft, only mild tenderness elicited in the lower abdomen.  No tenderness on left upper quadrant., nondistended, bowel sound present,  CNS: Alert, awake, oriented x 3 Psychiatry: Mood appropriate,  Extremities: No pedal edema, no calf tenderness,   Follow ups:    Follow-up Information     Julieanne Manson, MD Follow up.   Specialty: Internal Medicine Contact information: [phone]  [address] [place] [address] [phone]                 Discharge Instructions:   Discharge Instructions     Ambulatory referral to Gastroenterology   Complete by: As directed    Functional dyspepsia.  Low hemoglobin.  May need EGD   What is the reason for referral?: Other   Call MD for:  difficulty breathing, headache or visual disturbances   Complete by: As directed    Call MD for:  extreme fatigue   Complete by: As directed    Call MD for:  hives   Complete by: As directed    Call MD for:  persistant dizziness or light-headedness   Complete by: As directed    Call MD for:  persistant nausea and vomiting   Complete by: As directed    Call MD for:  severe uncontrolled pain   Complete by: As directed    Call MD for:  temperature >100.4   Complete by: As directed    Diet general   Complete by: As directed    Discharge instructions   Complete by: As directed    Recommendations at discharge:   You been started on Protonix for functional dyspepsia.  Outpatient referral for GI given.  For blood pressure, continue Toprol and amlodipine.  Losartan has been on hold because of  impaired kidney function.    Follow-up with PCP before reinitiation  General discharge instructions: Follow with Primary MD Julieanne Manson, MD in 7 days  Please request your PCP  to go over your hospital tests, procedures, radiology results at the follow up. Please get your medicines reviewed and adjusted.  Your PCP may decide to repeat certain labs or tests as needed. Do not drive, operate heavy machinery, perform activities at heights, swimming or participation in water activities or provide baby sitting services if your were admitted for syncope or siezures until you have seen by Primary MD or a Neurologist and advised to do so again. [place] Controlled Substance Reporting System database was reviewed. Do not drive, operate heavy machinery, perform activities at heights, swim, participate in water activities or provide baby-sitting services while on medications for pain, sleep and mood until your outpatient physician has reevaluated you and advised to do so again.  You are strongly recommended to comply with the dose, frequency and duration of prescribed medications. Activity: As tolerated with Full fall precautions use walker/cane & assistance as needed Avoid using any recreational substances like cigarette, tobacco, alcohol, or non-prescribed drug. If you experience worsening of your admission symptoms, develop shortness of breath, life threatening emergency, suicidal or homicidal thoughts you must seek medical attention immediately by calling 911 or calling your MD immediately  if symptoms less severe. You must read complete instructions/literature along with all the possible adverse reactions/side effects for all the medicines you take and that have been prescribed to you. Take any new medicine only after you have completely understood and accepted all the possible adverse reactions/side effects.  Wear Seat belts while driving. You were cared for by a hospitalist during your hospital  stay. If you have any questions about your discharge medications or the care you received while you were in the hospital after you are discharged, you can call the unit and ask to speak with the hospitalist or the covering physician. Once you are discharged, your primary care physician will handle any further medical issues. Please note that NO REFILLS for any discharge medications will  be authorized once you are discharged, as it is imperative that you return to your primary care physician (or establish a relationship with a primary care physician if you do not have one).   Increase activity slowly   Complete by: As directed        Discharge Medications:   Allergies as of 01/11/2024   No Known Allergies      Medication List     TAKE these medications    acetaminophen 325 MG tablet Commonly known as: TYLENOL Take 650 mg by mouth every 6 (six) hours as needed for headache or mild pain (pain score 1-3).   alendronate 70 MG tablet Commonly known as: FOSAMAX Take 1 tablet (70 mg total) by mouth every 7 (seven) days. Take with a full glass of water on an empty stomach.   amLODipine 10 MG tablet Commonly known as: NORVASC TAKE 1 TABLET BY MOUTH ONCE DAILY WITH MORNING MEAL   Calcium Citrate 250 MG Tabs Take 2 tablets (500 mg total) by mouth 2 (two) times daily.   fluocinonide 0.05 % external solution Commonly known as: LIDEX Apply to affected areas of scalp twice daily as needed. What changed:  how much to take how to take this when to take this reasons to take this additional instructions   losartan 100 MG tablet Commonly known as: COZAAR Take 1 tablet (100 mg total) by mouth daily.   metoprolol succinate 25 MG 24 hr tablet Commonly known as: TOPROL-XL TAKE 1 TABLET BY MOUTH ONCE DAILY WITH EVENING MEAL   ondansetron 4 MG disintegrating tablet Commonly known as: ZOFRAN-ODT Take 1 tablet (4 mg total) by mouth every 8 (eight) hours as needed for nausea or vomiting.    pantoprazole 40 MG tablet Commonly known as: PROTONIX Take 1 tablet (40 mg total) by mouth daily before breakfast. Start taking on: January 12, 2024   traMADol 50 MG tablet Commonly known as: Ultram Take 1 tablet (50 mg total) by mouth every 8 (eight) hours as needed for moderate pain (pain score 4-6) or severe pain (pain score 7-10).   triamcinolone cream 0.1 % Commonly known as: KENALOG Apply to affected area twice daily as needed.   Vitamin D3 25 MCG (1000 UT) Caps Take 1 capsule (1,000 Units total) by mouth daily.         The results of significant diagnostics from this hospitalization (including imaging, microbiology, ancillary and laboratory) are listed below for reference.    Procedures and Diagnostic Studies:   CT Head Wo Contrast Result Date: 01/09/2024 CLINICAL DATA:  Headaches EXAM: CT HEAD WITHOUT CONTRAST TECHNIQUE: Contiguous axial images were obtained from the base of the skull through the vertex without intravenous contrast. RADIATION DOSE REDUCTION: This exam was performed according to the departmental dose-optimization program which includes automated exposure control, adjustment of the mA and/or kV according to patient size and/or use of iterative reconstruction technique. COMPARISON:  None Available. FINDINGS: Brain: No evidence of acute infarction, hemorrhage, hydrocephalus, extra-axial collection or mass lesion/mass effect. Vascular: No hyperdense vessel or unexpected calcification. Skull: Normal. Negative for fracture or focal lesion. Sinuses/Orbits: No acute finding. Other: None. IMPRESSION: No acute intracranial abnormality noted. Electronically Signed   By: Alcide Clever M.D.   On: 01/09/2024 00:19   CT ABDOMEN PELVIS W CONTRAST Result Date: 01/08/2024 CLINICAL DATA:  Abdominal pain, acute, nonlocalized. Seen here 5 days ago for the same symptoms EXAM: CT ABDOMEN AND PELVIS WITH CONTRAST TECHNIQUE: Multidetector CT imaging of the abdomen and pelvis was  performed using the standard protocol following bolus administration of intravenous contrast. RADIATION DOSE REDUCTION: This exam was performed according to the departmental dose-optimization program which includes automated exposure control, adjustment of the mA and/or kV according to patient size and/or use of iterative reconstruction technique. CONTRAST:  75mL OMNIPAQUE IOHEXOL 350 MG/ML SOLN COMPARISON:  CT without contrast 01/03/2024. FINDINGS: Lower chest: There is mosaic attenuation in the lower lobes which is probably due to air trapping and small airways disease or low inspiration. Mild asymmetric elevation of the right hemidiaphragm is again shown. There is mild cardiomegaly. No acute lung base process. Hepatobiliary: The liver 17 cm length slightly steatotic. There is no mass enhancement. The gallbladder and bile ducts are unremarkable. Pancreas: Upper limits of normal ductal caliber. No other abnormality is seen. No mass. Spleen: There's subcapsular hypoattenuation over portions of the superior pole of the spleen, which could indicate evidence of an embolic infarct or grade 2 splenic injury with subcapsular hematoma. There are no overlying displaced rib fractures. No perisplenic hematoma is seen. Motion artifact through this area exaggerates the findings but does not fully explain the hypoattenuation. Adrenals/Urinary Tract: There is no adrenal mass. There is a 1.2 cm Bosniak 1 cyst in the midpole right kidney, Hounsfield density is 14.6. Cortical thinning is noted in both kidneys, somewhat more on the left without other focal abnormality. There is symmetric excretion of the kidneys in the delayed images. There is no urinary stone or obstruction. There is mild thickening of the bladder versus changes of underdistention. Correlate with urinalysis for possible cystitis. Stomach/Bowel: No dilatation or overt wall thickening including the appendix. Vascular/Lymphatic: There is moderate aortoiliac mixed  plaque, visceral branch vessel atherosclerosis including the splenic artery. There is engorgement of left ovarian vein with left pelvic venous congestion. No adenopathy is seen. Reproductive: Small atrophic uterus.  No adnexal masses seen. Other: No abdominal wall hernia or abnormality. No abdominopelvic ascites. Musculoskeletal: There are degenerative changes of the spine. No acute or other significant osseous findings. IMPRESSION: 1. Subcapsular hypoattenuation over portions of the superior pole of the spleen, which could indicate evidence of an embolic infarct or grade 2 splenic injury with subcapsular hematoma. There are no overlying displaced rib fractures. No perisplenic hematoma. 2. Mild hepatic steatosis. 3. Aortic and visceral branch vessel atherosclerosis. 4. Mild cardiomegaly. 5. Mosaic attenuation in the lower lobes probably due to air trapping and small airways disease or low inspiration. 6. Mild bladder thickening versus changes of underdistention. Correlate with urinalysis for possible cystitis. 7. Bosniak 1 cyst in the right kidney. No follow-up recommended. 8. Left pelvic venous congestion and engorged left gonadal vein consistent with venous incompetence. Aortic Atherosclerosis (ICD10-I70.0). Electronically Signed   By: Almira Bar M.D.   On: 01/08/2024 23:15     Labs:   Basic Metabolic Panel: Recent Labs  Lab 01/08/24 2149 01/08/24 2353 01/09/24 0051 01/09/24 0400 01/09/24 1215 01/10/24 0448  NA 119*  --  120* 129* 136 137  K 3.7  --  4.5 4.1 4.1 4.2  CL 86*  --  88* 97* 103 106  CO2 21*  --   --  21* 24 23  GLUCOSE 143*  --  128* 112* 96 104*  BUN 8  --  9 7* 6* 9  CREATININE 1.37*  --  1.50* 1.26* 1.63* 1.16*  CALCIUM 8.4*  --   --  7.7* 8.4* 8.1*  MG  --  1.3*  --   --   --   --  GFR Estimated Creatinine Clearance: 34.3 mL/min (A) (by C-G formula based on SCr of 1.16 mg/dL (H)). Liver Function Tests: Recent Labs  Lab 01/08/24 2149  AST 22  ALT 13  ALKPHOS  39  BILITOT 0.9  PROT 7.1  ALBUMIN 3.9   Recent Labs  Lab 01/08/24 2149  LIPASE 48   No results for input(s): "AMMONIA" in the last 168 hours. Coagulation profile No results for input(s): "INR", "PROTIME" in the last 168 hours.  CBC: Recent Labs  Lab 01/08/24 2149 01/09/24 0051 01/10/24 0448  WBC 7.0  --  3.3*  NEUTROABS 5.4  --   --   HGB 11.0* 10.9* 9.1*  HCT 32.3* 32.0* 27.8*  MCV 76.2*  --  78.8*  PLT 195  --  162   Cardiac Enzymes: No results for input(s): "CKTOTAL", "CKMB", "CKMBINDEX", "TROPONINI" in the last 168 hours. BNP: Invalid input(s): "POCBNP" CBG: No results for input(s): "GLUCAP" in the last 168 hours. D-Dimer No results for input(s): "DDIMER" in the last 72 hours. Hgb A1c No results for input(s): "HGBA1C" in the last 72 hours. Lipid Profile No results for input(s): "CHOL", "HDL", "LDLCALC", "TRIG", "CHOLHDL", "LDLDIRECT" in the last 72 hours. Thyroid function studies No results for input(s): "TSH", "T4TOTAL", "T3FREE", "THYROIDAB" in the last 72 hours.  Invalid input(s): "FREET3" Anemia work up No results for input(s): "VITAMINB12", "FOLATE", "FERRITIN", "TIBC", "IRON", "RETICCTPCT" in the last 72 hours. Microbiology No results found for this or any previous visit (from the past 240 hours).  Time coordinating discharge: 45 minutes  Signed: Tieshia Rettinger  [facility] 01/11/2024, 11:08 AM

## 2024-01-11 NOTE — Plan of Care (Signed)

## 2024-01-11 NOTE — Telephone Encounter (Signed)
Patient would like to continue to be on wait list for after ED appointment.

## 2024-01-11 NOTE — Plan of Care (Signed)
  Problem: Education: Goal: Knowledge of General Education information will improve Description: Including pain rating scale, medication(s)/side effects and non-pharmacologic comfort measures Outcome: Progressing   Problem: Activity: Goal: Risk for activity intolerance will decrease Outcome: Progressing   Problem: Coping: Goal: Level of anxiety will decrease Outcome: Progressing   Problem: Elimination: Goal: Will not experience complications related to urinary retention Outcome: Progressing   Problem: Pain Managment: Goal: General experience of comfort will improve and/or be controlled Outcome: Progressing   Problem: Safety: Goal: Ability to remain free from injury will improve Outcome: Progressing   Problem: Skin Integrity: Goal: Risk for impaired skin integrity will decrease Outcome: Progressing

## 2024-01-18 ENCOUNTER — Ambulatory Visit: Payer: Medicaid Other | Admitting: Internal Medicine

## 2024-01-18 ENCOUNTER — Encounter: Payer: Self-pay | Admitting: Internal Medicine

## 2024-01-18 VITALS — BP 138/64 | HR 64 | Resp 16 | Ht 60.0 in | Wt 99.0 lb

## 2024-01-18 DIAGNOSIS — I10 Essential (primary) hypertension: Secondary | ICD-10-CM | POA: Diagnosis not present

## 2024-01-18 DIAGNOSIS — R7303 Prediabetes: Secondary | ICD-10-CM | POA: Diagnosis not present

## 2024-01-18 DIAGNOSIS — N179 Acute kidney failure, unspecified: Secondary | ICD-10-CM

## 2024-01-18 DIAGNOSIS — D649 Anemia, unspecified: Secondary | ICD-10-CM

## 2024-01-18 DIAGNOSIS — E871 Hypo-osmolality and hyponatremia: Secondary | ICD-10-CM

## 2024-01-18 DIAGNOSIS — M810 Age-related osteoporosis without current pathological fracture: Secondary | ICD-10-CM

## 2024-01-18 DIAGNOSIS — N189 Chronic kidney disease, unspecified: Secondary | ICD-10-CM

## 2024-01-18 NOTE — Progress Notes (Signed)
 Subjective:    Patient ID: Laura Harris, female   DOB: 10/04/1956, 68 y.o.   MRN: [id number]   HPI  Daughter, Wynelle Link, interprets   Hospitalizations:  Here for follow up of 2 hospitalizations for nausea, vomiting after returning from a 3 month trip to [place].  Started the day she started her return with diarrhea and abdominal pain and decreased oral intake.  Ar[place] in [place] on the 8th of February.  Ultimately fainted in bathroom on the 11th and family took her to ED for first hospitalization.  Family is uncertain if she fell hard in the bathroom at the time as home was crowded and noisy.  Just realized she was in [place] for prolonged time.  She states she was awake when son found her and could not remember falling.   Had one episode of vomiting at home and then another at ED  Found to have AKI, which resolved with IV hydration.  CT without contrast was normal.  She was discharged and just had difficulty with oral intake and abdominal pain and returned on 01/08/2024.  Felt generalized weakness and difficulty walking.  Sodium on admission was 119.  Hydrated with isotonic saline with improvement and repeat CT with contrast showed possible bleed above spleen, but NT there and felt she could just be followed.  Again, family is not certain if she had a significant hard fall in the bathroom prior to bringing her into hospital with first visit.  She was discharged on 01/11/24 with second hospitalization  She is not certain through all of this if she was taking her alendronate regularly.   Since discharge, she has been able to drink fluids fine.  Her appetite for solids is gradually improving.  She is not having abdominal pain.   No nausea or vomiting.  No diarrhea Does not appear she has taken any Tramadol since her second hospitalization.   She has not needed Ondansetron Taking Pantoprazole 40 mg since second hospitalization Sounds like she took with her alendronate this past Monday.  She is down 4 lbs from  her usual weight of 103 lbs.    2.  Anemia:  sometimes microcytic and at time normocytic.  FIT negative in 2023.  Iron studies have been normal, including ferritin, RBC folate and B12 normal.  Has not had quantitative hemoglobin electrophoresis in past.   She did have colonoscopy in 2023 with Dr. Russella Dar that did not find any source for possible blood loss.  She has not undergone evaluation with EGD in past.   Has had normal hemoglobin as late as 11/2019 at 12.7, but mainly in 10 range since.    Current Meds  Medication Sig   acetaminophen (TYLENOL) 325 MG tablet Take 650 mg by mouth every 6 (six) hours as needed for headache or mild pain (pain score 1-3).   alendronate (FOSAMAX) 70 MG tablet Take 1 tablet (70 mg total) by mouth every 7 (seven) days. Take with a full glass of water on an empty stomach.   amLODipine (NORVASC) 10 MG tablet TAKE 1 TABLET BY MOUTH ONCE DAILY WITH MORNING MEAL   Calcium Citrate 250 MG TABS Take 2 tablets (500 mg total) by mouth 2 (two) times daily.   Cholecalciferol (VITAMIN D3) 25 MCG (1000 UT) CAPS Take 1 capsule (1,000 Units total) by mouth daily.   fluocinonide (LIDEX) 0.05 % external solution Apply to affected areas of scalp twice daily as needed. (Patient taking differently: Apply 1 Application topically 2 (two) times daily  as needed (for rash). Apply to affected areas of scalp)   metoprolol succinate (TOPROL-XL) 25 MG 24 hr tablet TAKE 1 TABLET BY MOUTH ONCE DAILY WITH EVENING MEAL   pantoprazole (PROTONIX) 40 MG tablet Take 1 tablet (40 mg total) by mouth daily before breakfast.     No Known Allergies   Review of Systems    Objective:   BP 138/64 (BP Location: Right Arm, Patient Position: Sitting, Cuff Size: Normal)   Pulse 64   Resp 16   Ht 5' (1.524 m)   Wt 99 lb (44.9 kg)   BMI 19.33 kg/m   Physical Exam Pale and mildly ill appearing.   HEENT:  Palpebral conjunctivae pale. PERRL, EOMI, MMM Neck:  Supple, No adenopathy Chest:  CTA CV:  RRR  without murmur or rub.  Radial and DP pulses normal and equal Abd:  S, NT, No HSM or mass, + BS LE:  No edema   Assessment & Plan   AKI:  CMP today.  If Creatinine back to baseline, consider reinstitution of Losartan at 50 mg.  Hold for now.Continue beta blocker and amlodipine.  Discussed also in future, if she has a  GI disturbance to hold alendronate until better  2. Hypertension:  as above with BP meds.  BP fine currently  3.  Anemia:  will check with [facility] about referral from hospital for EGD.    4.  Hyponatremia:  CMP  5.  Prediabetes:  A1C  6.  Osteoporosis:  Alendronate on Mondays.  To take pantoprazole that day at bedtime instead of morning.

## 2024-01-18 NOTE — Telephone Encounter (Signed)
 Patient has been scheduled

## 2024-01-19 LAB — COMPREHENSIVE METABOLIC PANEL
ALT: 13 [IU]/L (ref 0–32)
AST: 18 [IU]/L (ref 0–40)
Albumin: 4.5 g/dL (ref 3.9–4.9)
Alkaline Phosphatase: 42 [IU]/L — ABNORMAL LOW (ref 44–121)
BUN/Creatinine Ratio: 11 — ABNORMAL LOW (ref 12–28)
BUN: 11 mg/dL (ref 8–27)
Bilirubin Total: 0.4 mg/dL (ref 0.0–1.2)
CO2: 25 mmol/L (ref 20–29)
Calcium: 9.1 mg/dL (ref 8.7–10.3)
Chloride: 101 mmol/L (ref 96–106)
Creatinine, Ser: 1 mg/dL (ref 0.57–1.00)
Globulin, Total: 2.4 g/dL (ref 1.5–4.5)
Glucose: 87 mg/dL (ref 70–99)
Potassium: 4.2 mmol/L (ref 3.5–5.2)
Sodium: 140 mmol/L (ref 134–144)
Total Protein: 6.9 g/dL (ref 6.0–8.5)
eGFR: 62 mL/min/{1.73_m2} (ref >59)

## 2024-01-19 LAB — CBC WITH DIFFERENTIAL/PLATELET
Basophils Absolute: 0 10*3/uL (ref 0.0–0.2)
Basos: 1 %
EOS (ABSOLUTE): 0.2 10*3/uL (ref 0.0–0.4)
Eos: 4 %
Hematocrit: 30.6 % — ABNORMAL LOW (ref 34.0–46.6)
Hemoglobin: 9.7 g/dL — ABNORMAL LOW (ref 11.1–15.9)
Immature Grans (Abs): 0 10*3/uL (ref 0.0–0.1)
Immature Granulocytes: 0 %
Lymphocytes Absolute: 1 10*3/uL (ref 0.7–3.1)
Lymphs: 22 %
MCH: 26 pg — ABNORMAL LOW (ref 26.6–33.0)
MCHC: 31.7 g/dL (ref 31.5–35.7)
MCV: 82 fL (ref 79–97)
Monocytes Absolute: 0.3 10*3/uL (ref 0.1–0.9)
Monocytes: 6 %
Neutrophils Absolute: 3.2 10*3/uL (ref 1.4–7.0)
Neutrophils: 67 %
Platelets: 203 10*3/uL (ref 150–450)
RBC: 3.73 x10E6/uL — ABNORMAL LOW (ref 3.77–5.28)
RDW: 13.3 % (ref 11.7–15.4)
WBC: 4.8 10*3/uL (ref 3.4–10.8)

## 2024-01-19 LAB — HGB A1C W/O EAG: Hgb A1c MFr Bld: 5.6 % (ref 4.8–5.6)

## 2024-02-15 ENCOUNTER — Other Ambulatory Visit: Payer: Medicaid Other

## 2024-02-19 ENCOUNTER — Telehealth: Payer: Self-pay

## 2024-02-19 ENCOUNTER — Other Ambulatory Visit

## 2024-02-19 DIAGNOSIS — D649 Anemia, unspecified: Secondary | ICD-10-CM

## 2024-02-19 NOTE — Telephone Encounter (Signed)
 Patient has a CPE appointment on 02/29/24, patient's daughter would like for patient to be on wait list if there is a cacellation on a Monday or Tuesday.

## 2024-02-22 LAB — FOLATE RBC
Folate, Hemolysate: 403 ng/mL
Folate, RBC: 1178 ng/mL (ref >498)
Hematocrit: 34.2 % (ref 34.0–46.6)

## 2024-02-22 LAB — HGB FRACTIONATION CASCADE: Hgb A2: 1.8 % (ref 1.8–3.2)

## 2024-02-22 LAB — HGB FRACTIONATION BY HPLC
Hgb A: 97.5 % (ref 96.4–98.8)
Hgb C: 0 %
Hgb E: 0 %
Hgb F: 0 % (ref 0.0–2.0)
Hgb S: 0 %
Hgb Variant: 0.7 % — ABNORMAL HIGH

## 2024-02-22 LAB — VITAMIN D 25 HYDROXY (VIT D DEFICIENCY, FRACTURES): Vit D, 25-Hydroxy: 39.5 ng/mL (ref 30.0–100.0)

## 2024-02-22 LAB — VITAMIN B12: Vitamin B-12: 1084 pg/mL (ref 232–1245)

## 2024-02-29 ENCOUNTER — Encounter: Payer: Medicaid Other | Admitting: Internal Medicine

## 2024-03-04 ENCOUNTER — Encounter: Payer: Self-pay | Admitting: Internal Medicine

## 2024-03-26 ENCOUNTER — Ambulatory Visit: Admitting: Physician Assistant

## 2024-03-26 ENCOUNTER — Encounter: Payer: Self-pay | Admitting: Physician Assistant

## 2024-03-26 ENCOUNTER — Other Ambulatory Visit (INDEPENDENT_AMBULATORY_CARE_PROVIDER_SITE_OTHER)

## 2024-03-26 VITALS — BP 140/74 | HR 68 | Ht 60.0 in | Wt 102.5 lb

## 2024-03-26 DIAGNOSIS — K219 Gastro-esophageal reflux disease without esophagitis: Secondary | ICD-10-CM | POA: Diagnosis not present

## 2024-03-26 DIAGNOSIS — D649 Anemia, unspecified: Secondary | ICD-10-CM

## 2024-03-26 DIAGNOSIS — R1013 Epigastric pain: Secondary | ICD-10-CM

## 2024-03-26 LAB — B12 AND FOLATE PANEL
Folate: >25.2 ng/mL (ref >5.9)
Vitamin B-12: 717 pg/mL (ref 211–911)

## 2024-03-26 LAB — CBC WITH DIFFERENTIAL/PLATELET
Basophils Absolute: 0 10*3/uL (ref 0.0–0.1)
Basophils Relative: 0.7 % (ref 0.0–3.0)
Eosinophils Absolute: 0.2 10*3/uL (ref 0.0–0.7)
Eosinophils Relative: 4.5 % (ref 0.0–5.0)
HCT: 38.4 % (ref 36.0–46.0)
Hemoglobin: 12 g/dL (ref 12.0–15.0)
Lymphocytes Relative: 27.1 % (ref 12.0–46.0)
Lymphs Abs: 1 10*3/uL (ref 0.7–4.0)
MCHC: 31.4 g/dL (ref 30.0–36.0)
MCV: 79.5 fl (ref 78.0–100.0)
Monocytes Absolute: 0.3 10*3/uL (ref 0.1–1.0)
Monocytes Relative: 6.8 % (ref 3.0–12.0)
Neutro Abs: 2.3 10*3/uL (ref 1.4–7.7)
Neutrophils Relative %: 60.9 % (ref 43.0–77.0)
Platelets: 186 10*3/uL (ref 150.0–400.0)
RBC: 4.83 Mil/uL (ref 3.87–5.11)
RDW: 12.9 % (ref 11.5–15.5)
WBC: 3.8 10*3/uL — ABNORMAL LOW (ref 4.0–10.5)

## 2024-03-26 MED ORDER — ESOMEPRAZOLE MAGNESIUM 40 MG PO CPDR: 40.0000 mg | DELAYED_RELEASE_CAPSULE | Freq: Every day | ORAL | 5 refills | Status: AC

## 2024-03-26 NOTE — Patient Instructions (Signed)
 Your provider has requested that you go to the basement level for lab work before leaving today. Press "B" on the elevator. The lab is located at the first door on the left as you exit the elevator.  We have sent the following medications to your pharmacy for you to pick up at your convenience: Nexium 40 mg daily   You have been scheduled for an endoscopy. Please follow written instructions given to you at your visit today.  If you use inhalers (even only as needed), please bring them with you on the day of your procedure.  If you take any of the following medications, they will need to be adjusted prior to your procedure:   DO NOT TAKE 7 DAYS PRIOR TO TEST- Trulicity (dulaglutide) Ozempic, Wegovy (semaglutide) Mounjaro (tirzepatide) Bydureon Bcise (exanatide extended release)  DO NOT TAKE 1 DAY PRIOR TO YOUR TEST Rybelsus (semaglutide) Adlyxin (lixisenatide) Victoza (liraglutide) Byetta (exanatide) ___________________________________________________________________________  Please follow up sooner if symptoms increase or worsen  Due to recent changes in healthcare laws, you may see the results of your imaging and laboratory studies on MyChart before your provider has had a chance to review them.  We understand that in some cases there may be results that are confusing or concerning to you. Not all laboratory results come back in the same time frame and the provider may be waiting for multiple results in order to interpret others.  Please give us  48 hours in order for your provider to thoroughly review all the results before contacting the office for clarification of your results.   _______________________________________________________  If your blood pressure at your visit was 140/90 or greater, please contact your primary care physician to follow up on this.  _______________________________________________________  If you are age 21 or older, your body mass index should be between  23-30. Your Body mass index is 20.02 kg/m. If this is out of the aforementioned range listed, please consider follow up with your Primary Care Provider.  If you are age 50 or younger, your body mass index should be between 19-25. Your Body mass index is 20.02 kg/m. If this is out of the aformentioned range listed, please consider follow up with your Primary Care Provider.   ________________________________________________________  The [facility] providers would like to encourage you to use MYCHART to communicate with providers for non-urgent requests or questions.  Due to long hold times on the telephone, sending your provider a message by [facility] may be a faster and more efficient way to get a response.  Please allow 48 business hours for a response.  Please remember that this is for non-urgent requests.  _______________________________________________________ Thank you for trusting me with your gastrointestinal care!   Brigitte Canard, PA

## 2024-03-26 NOTE — Progress Notes (Signed)
 se     Brigitte Canard, PA-C [address] [place], [place]  [address] Phone: [phone]   Primary Care Physician: Ronalee Cocking, MD  Primary Gastroenterologist:  Brigitte Canard, PA-C / Darol Elizabeth, MD   Chief Complaint: Anemia, nausea, GERD, Epigastric Pain.   HPI:   Laura Harris is a 68 y.o. [place] female presents for follow-up of anemia.  She is here today with interpreter from [facility]).  Patient's daughter also speaks [place] and is helping interpret.  Patient previously saw Dr. Howard Macho and Dr. Sandrea Cruel in our office in 2023 for constipation.  Has history of chronic constipation for at least 12 years.    07/2022 colonoscopy by Dr. Sandrea Cruel showed small internal hemorrhoids, otherwise normal.  No polyps.  Excellent prep.  10-year repeat.  No Previous EGD.  Current Symptoms: Patient has had episodes of heartburn with acid reflux for 1 year.  Reports burning in the epigastrium for 6 months.  Also has some left side abdominal pain in the LUQ and LLQ, intermittent cramping.  She has diarrhea alternating with constipation.  Has nausea but no vomiting.  Had 1 episode of bright red blood in her stool in February.  No recent episodes of melena or hematochezia.  Weight has been stable.  Not currently on iron.  She was on pantoprazole  40 Mg daily which helped her acid reflux mildly.  She has run out of medication.  She is requesting medicine to help with acid reflux and heartburn.  Patient saw her PCP, Dr. Jayne Mews, 01/18/2024 to evaluate recurrent nausea and vomiting.  She had 2 hospitalizations for nausea and vomiting after returning from a 35-month trip to [place].  She also had diarrhea, abdominal pain, and decreased appetite.  She returned to the [place]  During hospitalization was found to have acute kidney injury which resolved with IV hydration.  CT with contrast was normal.  Treated for hyponatremia.  She returned for second hospitalization 01/08/2024  after she had a weakness, syncopal episode, and fall at home.  Repeat CT with contrast showed possible bleed above the spleen.  Discharged 01/11/2024.  After her hospital discharge her abdominal pain, nausea, vomiting, diarrhea resolved.   Regarding Chronic Anemia:  sometimes microcytic and normocytic.  FIT negative in 2023.  Iron studies have been normal, including ferritin, RBC folate and B12 normal.  Has not had quantitative hemoglobin electrophoresis in past.  Has had normal hemoglobin as late as 11/2019 at 12.7, but mainly in 10 range since. No previous Hematology evaluation.  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  Labs:          Component Ref Range & Units (hover) 2 mo ago (01/18/24) 2 mo ago (01/10/24) 2 mo ago (01/09/24) 2 mo ago (01/08/24) 2 mo ago (01/04/24) 2 mo ago (01/03/24) 2 mo ago (01/03/24)  WBC 4.8 3.3 Low  R  7.0 R 4.1 R  4.8 R  RBC 3.73 Low  3.53 Low  R  4.24 R 3.74 Low  R  3.53 Low  R  Hemoglobin 9.7 Low  9.1 Low  R 10.9 Low  R 11.0 Low  R 9.7 Low  R 9.3 Low  R 9.2 Low  R  Hematocrit 30.6 Low  27.8 Low  R 32.0 Low  R 32.3 Low  R 29.9 Low  R 29.6 Low  R, CM 29.6 Low  R  MCV 82 78.8 Low  R  76.2 Low  R 79.9 Low  R  83.9 R  MCH 26.0 Low  25.8 Low  R  25.9 Low  R 25.9 Low  R  26.1 R  MCHC 31.7 32.7 R  34.1 R 32.4 R  31.1 R  RDW 13.3 13.0 R  12.2 R 13.2 R  13.5 R  Platelets 203 162 R  195 R 129 Low  R, CM  116 Low  R, CM   01/12/24 Lab: Iron 31, Iron Sat 13%. 02/19/24 Lab: Vitamin B12 of 1,084.  Prior to Admission medications   Medication Sig Start Date End Date Taking? Authorizing Provider  acetaminophen  (TYLENOL ) 325 MG tablet Take 650 mg by mouth every 6 (six) hours as needed for headache or mild pain (pain score 1-3).   Yes [provider]  alendronate  (FOSAMAX ) 70 MG tablet Take 1 tablet (70 mg total) by mouth every 7 (seven) days. Take with a full glass of water on an empty stomach. 09/12/23  Yes Ronalee Cocking, MD  amLODipine  (NORVASC ) 10 MG tablet TAKE 1 TABLET BY MOUTH ONCE DAILY WITH MORNING MEAL 09/12/23  Yes Ronalee Cocking, MD  Calcium  Citrate 250 MG TABS Take 2 tablets (500 mg total) by mouth 2 (two) times daily. 12/12/22  Yes Ronalee Cocking, MD  Cholecalciferol (VITAMIN D3) 25 MCG (1000 UT) CAPS Take 1 capsule (1,000 Units total) by mouth daily. 12/12/22  Yes Ronalee Cocking, MD  fluocinonide  (LIDEX ) 0.05 % external solution Apply to affected areas of scalp twice daily as needed. Patient taking differently: Apply 1 Application topically 2 (two) times daily as needed (for rash). Apply to affected areas of scalp 09/12/23  Yes Ronalee Cocking, MD  losartan  (COZAAR ) 100 MG tablet Take 1 tablet (100 mg total) by mouth daily. 09/12/23  Yes Ronalee Cocking, MD  metoprolol  succinate (TOPROL -XL) 25 MG 24 hr tablet TAKE 1 TABLET BY MOUTH ONCE DAILY WITH EVENING MEAL 09/12/23  Yes Ronalee Cocking, MD  triamcinolone  cream (KENALOG ) 0.1 % Apply to affected area twice daily as needed. 12/09/22  Yes Ronalee Cocking, MD      Allergies as of 03/26/2024   (No Known Allergies)    Past Medical History:  Diagnosis Date   Anemia    Bilateral knee pain    CKD (chronic kidney disease) 2017   Hemoglobin [place] trait 2025   Hyperlipidemia 10/18/2020   Hypertension     Past Surgical History:  Procedure Laterality Date   NO PAST SURGERIES      Review of Systems:    All systems reviewed and negative except where noted in HPI.    Physical Exam:  BP (!) 140/74 (BP Location: Left Arm, Patient Position: Sitting, Cuff Size: Normal)   Pulse 68   Ht 5' (1.524 m)   Wt 102 lb 8 oz (46.5 kg)   BMI 20.02 kg/m  No LMP recorded. Patient is postmenopausal.  General: Well-nourished, well-developed, thin female, in no acute distress.  Lungs: Clear to auscultation bilaterally. Non-labored. Heart: Regular rate and rhythm, no murmurs rubs or gallops.  Abdomen: Bowel  sounds are normal; Abdomen is Soft; No hepatosplenomegaly, masses or hernias;  Mild to moderate Epigastric, LUQ and LLQ Abdominal Tenderness; No guarding or rebound tenderness.  No right side abdominal tenderness. Neuro: Alert and oriented x 3.  Grossly intact.  Psych: Alert and cooperative, normal mood and affect.   Imaging Studies: No results found.  Labs: CBC    Component Value Date/Time   WBC 4.8 01/18/2024 1228   WBC 3.3 (  L) 01/10/2024 0448   RBC 3.73 (L) 01/18/2024 1228   RBC 3.53 (L) 01/10/2024 0448   HGB 9.7 (L) 01/18/2024 1228   HCT 34.2 02/19/2024 1034   PLT 203 01/18/2024 1228   MCV 82 01/18/2024 1228   MCH 26.0 (L) 01/18/2024 1228   MCH 25.8 (L) 01/10/2024 0448   MCHC 31.7 01/18/2024 1228   MCHC 32.7 01/10/2024 0448   RDW 13.3 01/18/2024 1228   LYMPHSABS 1.0 01/18/2024 1228   MONOABS 0.5 01/08/2024 2149   EOSABS 0.2 01/18/2024 1228   BASOSABS 0.0 01/18/2024 1228    CMP     Component Value Date/Time   NA 140 01/18/2024 1228   K 4.2 01/18/2024 1228   CL 101 01/18/2024 1228   CO2 25 01/18/2024 1228   GLUCOSE 87 01/18/2024 1228   GLUCOSE 104 (H) 01/10/2024 0448   BUN 11 01/18/2024 1228   CREATININE 1.00 01/18/2024 1228   CREATININE 1.04 04/02/2014 1011   CALCIUM  9.1 01/18/2024 1228   PROT 6.9 01/18/2024 1228   ALBUMIN 4.5 01/18/2024 1228   AST 18 01/18/2024 1228   ALT 13 01/18/2024 1228   ALKPHOS 42 (L) 01/18/2024 1228   BILITOT 0.4 01/18/2024 1228   GFRNONAA 52 (L) 01/10/2024 0448   GFRAA 63 09/25/2020 1100       Assessment and Plan:   Makaylyn Griepentrog is a 68 y.o. y/o [place] female is referred by her PCP to follow-up with chronic anemia.  Iron, folate, and B12 have been normal.  Negative colonoscopy 07/2022.  No previous EGD or hematology evaluation.  Her PCP recently drew more labs and diagnosed alpha thalassemia, likely trait.  PCP was still concerned about possible iron deficiency anemia or GI source for anemia.  She was in [place] for 3 months  from November 2024 untill February 2025.Aaron Aas  She return to the [place]  December 30, 2023.  After that she had several episodes of nausea, vomiting, abdominal pain, and diarrhea which required hospitalizations for dehydration, hyponatremia, and acute kidney injury.  Symptoms resolved with conservative treatment.  She was started on pantoprazole  40 Mg daily with mild benefit.  She ran out of medication and is requesting another medicine for acid reflux.  1.  Chronic anemia; alpha thalassemia trait versus iron deficiency? -Labs: CBC, iron panel, ferritin, B12, folate, and celiac lab.  -Scheduling EGD I discussed risks of EGD with patient to include risk of bleeding, perforation, and risk of sedation.  Patient expressed understanding and agrees to proceed with EGD.  - If EGD is unrevealing and Anemia persists, then Refer to hematology for further evaluation  2.  Epigastric Pain - H. pylori stool test - Schedule EGD - Rx Nexium 40mg  1 tablet daily.  3.  Chronic GERD - She tried Pantoprazole  which helped mildly.  Ran out of medication few weeks ago. - Rx Nexium (Esomeprazole) 40mg  1 tablet once daily, take 30 min before meal, #30, 5RF. - GERD diet.  4.  Colon Cancer Screening Is Up To Date.  Negative Colonoscopy 07/2022.  5.  Chronic constipation - Continue MiraLAX  daily.  Brigitte Canard, PA-C  Follow up 4 weeks after EGD with TG.

## 2024-03-27 LAB — IRON,TIBC AND FERRITIN PANEL
%SAT: 26 % (ref 16–45)
Ferritin: 35 ng/mL (ref 16–288)
Iron: 80 ug/dL (ref 45–160)
TIBC: 306 ug/dL (ref 250–450)

## 2024-03-28 LAB — CELIAC DISEASE AB SCREEN W/RFX
Antigliadin Abs, IgA: 5 U (ref 0–19)
IgA/Immunoglobulin A, Serum: 135 mg/dL (ref 87–352)

## 2024-03-28 NOTE — Progress Notes (Signed)
 Patient needs [place] interpreter.   Her daughter speaks [place] and interprets for patient. Call and notify patient / daughter labs show: 1.  Hemoglobin has greatly improved from 9.7 up to 12.0.  Within normal range now. 2.  Iron, B12, and folate are all normal. 3.  Celiac labs are negative.  No evidence of gluten allergy. 4.  Continue with current plan for EGD as scheduled. Brigitte Canard, PA-C

## 2024-03-29 ENCOUNTER — Other Ambulatory Visit

## 2024-03-29 DIAGNOSIS — R1013 Epigastric pain: Secondary | ICD-10-CM

## 2024-03-31 LAB — H. PYLORI ANTIGEN, STOOL: H pylori Ag, Stl: NEGATIVE

## 2024-04-03 ENCOUNTER — Ambulatory Visit: Payer: Self-pay | Admitting: *Deleted

## 2024-04-12 NOTE — Progress Notes (Signed)
 Agree with the assessment and plan as outlined by Brigitte Canard, PA-C.

## 2024-04-22 NOTE — Telephone Encounter (Signed)
 Called patient to offer appointment, patient did not take.

## 2024-04-25 ENCOUNTER — Encounter: Payer: Self-pay | Admitting: Gastroenterology

## 2024-04-25 ENCOUNTER — Ambulatory Visit (AMBULATORY_SURGERY_CENTER): Admitting: Gastroenterology

## 2024-04-25 VITALS — BP 133/71 | HR 71 | Temp 98.3°F | Resp 18 | Ht 60.0 in | Wt 102.0 lb

## 2024-04-25 DIAGNOSIS — R1013 Epigastric pain: Secondary | ICD-10-CM

## 2024-04-25 DIAGNOSIS — D649 Anemia, unspecified: Secondary | ICD-10-CM

## 2024-04-25 DIAGNOSIS — K319 Disease of stomach and duodenum, unspecified: Secondary | ICD-10-CM

## 2024-04-25 MED ORDER — SODIUM CHLORIDE 0.9 % IV SOLN: 500.0000 mL | Freq: Once | INTRAVENOUS | Status: DC

## 2024-04-25 NOTE — Progress Notes (Signed)
 Called to room to assist during endoscopic procedure.  Patient ID and intended procedure confirmed with present staff. Received instructions for my participation in the procedure from the performing physician.

## 2024-04-25 NOTE — Progress Notes (Signed)
 Sedate, gd SR, tolerated procedure well, VSS, report to RN

## 2024-04-25 NOTE — Op Note (Signed)
 [facility] Patient Name: Laura Harris Procedure Date: 04/25/2024 2:21 PM MRN: [id number] Endoscopist: Geralyn Knee E. Cherryl Corona , MD, [id number] Age: 68 Referring MD:  Date of Birth: 1956/06/01 Gender: Female Account #: [id number] Procedure:                Upper GI endoscopy Indications:              Epigastric abdominal pain Medicines:                Monitored Anesthesia Care Procedure:                Pre-Anesthesia Assessment:                           - Prior to the procedure, a History and Physical                            was performed, and patient medications and                            allergies were reviewed. The patient's tolerance of                            previous anesthesia was also reviewed. The risks                            and benefits of the procedure and the sedation                            options and risks were discussed with the patient.                            All questions were answered, and informed consent                            was obtained. Prior Anticoagulants: The patient has                            taken no anticoagulant or antiplatelet agents. ASA                            Grade Assessment: II - A patient with mild systemic                            disease. After reviewing the risks and benefits,                            the patient was deemed in satisfactory condition to                            undergo the procedure.                           After obtaining informed consent, the endoscope was  passed under direct vision. Throughout the                            procedure, the patient's blood pressure, pulse, and                            oxygen saturations were monitored continuously. The                            GIF [id number] #[id number] was introduced through the                            mouth, and advanced to the second part of duodenum.                            The upper GI endoscopy was  accomplished without                            difficulty. The patient tolerated the procedure                            well. Scope In: Scope Out: Findings:                 The examined portions of the nasopharynx,                            oropharynx and larynx were normal.                           The examined esophagus was normal.                           The entire examined stomach was normal. Biopsies                            were taken with a cold forceps for Helicobacter                            pylori testing. Estimated blood loss was minimal.                           The examined duodenum was normal. Complications:            No immediate complications. Estimated Blood Loss:     Estimated blood loss was minimal. Impression:               - The examined portions of the nasopharynx,                            oropharynx and larynx were normal.                           - Normal esophagus.                           - Normal  stomach. Biopsied.                           - Normal examined duodenum.                           - No endoscopic abnormalities to explain chronic                            epigastric pain. Recommendation:           - Patient has a contact number available for                            emergencies. The signs and symptoms of potential                            delayed complications were discussed with the                            patient. Return to normal activities tomorrow.                            Written discharge instructions were provided to the                            patient.                           - Resume previous diet.                           - Continue present medications.                           - Await pathology results. Adie Vilar E. Cherryl Corona, MD 04/25/2024 3:40:57 PM This report has been signed electronically.

## 2024-04-25 NOTE — Patient Instructions (Signed)

## 2024-04-25 NOTE — Progress Notes (Signed)
 History and Physical Interval Note:  04/25/2024 3:23 PM  Laura Harris  has presented today for endoscopic procedure(s), with the diagnosis of  Encounter Diagnoses  Name Primary?   Anemia, unspecified type Yes   Abdominal pain, epigastric   .  The various methods of evaluation and treatment have been discussed with the patient and/or family. After consideration of risks, benefits and other options for treatment, the patient has consented to  the endoscopic procedure(s).   The patient's history has been reviewed, patient examined, no change in status, stable for endoscopic procedure(s).  I have reviewed the patient's chart and labs.  Questions were answered to the patient's satisfaction.     [name] E. Cherryl Corona, MD [facility]

## 2024-04-26 ENCOUNTER — Telehealth: Payer: Self-pay

## 2024-04-26 NOTE — Telephone Encounter (Signed)
  Follow up Call-     04/25/2024    2:18 PM 07/26/2022    9:16 AM  Call back number  Post procedure Call Back phone  # [phone] Paulene Boron (daughter) [phone]  Permission to leave phone message Yes Yes   - Daughter, Paulene Boron, used for interpretation.   Patient questions:  Do you have a fever, pain , or abdominal swelling? No. Pain Score  0 *  Have you tolerated food without any problems? Yes.  Have you been able to return to your normal activities? Yes.    Do you have any questions about your discharge instructions: Diet   No. Medications  No. Follow up visit  No.  Do you have questions or concerns about your Care? No.  Actions: * If pain score is 4 or above: No action needed, pain <4.

## 2024-05-01 LAB — SURGICAL PATHOLOGY

## 2024-05-02 ENCOUNTER — Ambulatory Visit: Payer: Self-pay | Admitting: Gastroenterology

## 2024-05-02 NOTE — Progress Notes (Signed)
 Laura Harris,  The biopsies taken from your stomach were notable for mild reactive gastropathy which is a common finding and often related to use of certain medications (usually NSAIDs), but there was no evidence of Helicobacter pylori infection. This common finding is not felt to necessarily be a cause of any particular symptom and there is no specific treatment or further evaluation recommended.   Team,  Please let [name] know that her biopsies did not any abnormalities to explain her abdominal pain. She can follow up in the GI clinic as needed for ongoing management of her symptoms.  Will need interpreter

## 2024-05-03 ENCOUNTER — Encounter: Admitting: Gastroenterology

## 2024-05-03 NOTE — Telephone Encounter (Signed)
 Called patient to offer appointment, patient is currently out of town and will be back in July.

## 2024-06-05 ENCOUNTER — Telehealth: Payer: Self-pay | Admitting: Internal Medicine

## 2024-06-05 NOTE — Telephone Encounter (Signed)
 Patient's daughter called and stated that patient needs a refill for a cream medication that patient was prescribed before name of cream is Scout,

## 2024-06-12 NOTE — Telephone Encounter (Signed)
 Called patient to offer appointment , patient did not take.   Patient's daughter states patient is available any day after 10 am .

## 2024-06-14 NOTE — Telephone Encounter (Signed)
 Called patient to offer appointment, patient did not take.   Patient's daughter states patient will just wait for her upcoming CPE appointment in September.

## 2024-07-29 ENCOUNTER — Other Ambulatory Visit: Payer: Self-pay | Admitting: Internal Medicine

## 2024-07-29 ENCOUNTER — Encounter (HOSPITAL_COMMUNITY): Payer: Self-pay

## 2024-07-29 ENCOUNTER — Ambulatory Visit (HOSPITAL_COMMUNITY)
Admission: EM | Admit: 2024-07-29 | Discharge: 2024-07-29 | Disposition: A | Discharge status: Home / Self Care | Payer: Self-pay | Attending: Emergency Medicine | Admitting: Emergency Medicine

## 2024-07-29 DIAGNOSIS — R21 Rash and other nonspecific skin eruption: Secondary | ICD-10-CM

## 2024-07-29 MED ORDER — PREDNISONE 10 MG (21) PO TBPK: ORAL_TABLET | Freq: Every day | ORAL | 0 refills | Status: DC

## 2024-07-29 MED ORDER — DEXAMETHASONE SODIUM PHOSPHATE 10 MG/ML IJ SOLN: 10.0000 mg | Freq: Once | INTRAMUSCULAR | Status: DC

## 2024-07-29 NOTE — ED Triage Notes (Signed)
 Pt c/o itchy rash all over for a month. States worse over the weekend. States was given a cream and shampoo from her doctor but is out of it.

## 2024-07-29 NOTE — ED Provider Notes (Addendum)
 [facility]    CSN: [id number] Arrival date & time: 07/29/24  0849      History   Chief Complaint Chief Complaint  Patient presents with   Rash    HPI Laura Harris is a 68 y.o. female.   Patient presents with a rash all over her body this has been going on for about a month.  Patient reports that it worsened over the last few days, and she is now constantly itching. Patient does have a history of eczema and states that she has been applying some triamcinolone  cream to the areas.  Patient denies taking any other medication for her symptoms.  Of note patient does have a history of osteoporosis and takes alendronate  for this.  The history is provided by the patient and medical records. The history is limited by a language barrier. No language interpreter was used Garment/textile technologist declined, daughter is interpreting).  Rash   Past Medical History:  Diagnosis Date   Anemia    Bilateral knee pain    CKD (chronic kidney disease) 2017   Hemoglobin Constant Spring trait 2025   Hyperlipidemia 10/18/2020   Hypertension     Patient Active Problem List   Diagnosis Date Noted   Hyponatremia 01/09/2024   AKI (acute kidney injury) (HCC) 01/03/2024   Hemoglobin Constant Spring trait 2025   Anemia 05/18/2022   Postmenopausal 09/10/2021   Decreased visual acuity 09/10/2021   Hyperglycemia 07/05/2021   Hypercholesterolemia 08/28/2020   CKD (chronic kidney disease) 2017   Thyroid  nodule 03/12/2014   Primary hypertension 10/28/2013   Stomach pain 10/28/2013   Headache 10/28/2013    Past Surgical History:  Procedure Laterality Date   NO PAST SURGERIES      OB History   No obstetric history on file.      Home Medications    Prior to Admission medications   Medication Sig Start Date End Date Taking? Authorizing Provider  predniSONE  (STERAPRED UNI-PAK 21 TAB) 10 MG (21) TBPK tablet Take by mouth daily. Take 6 tabs by mouth daily  for 2 days, then 5 tabs for 2 days, then 4  tabs for 2 days, then 3 tabs for 2 days, 2 tabs for 2 days, then 1 tab by mouth daily for 2 days 07/29/24  Yes Johnie Flaming A, NP  acetaminophen  (TYLENOL ) 325 MG tablet Take 650 mg by mouth every 6 (six) hours as needed for headache or mild pain (pain score 1-3).    [provider]  alendronate  (FOSAMAX ) 70 MG tablet Take 1 tablet (70 mg total) by mouth every 7 (seven) days. Take with a full glass of water on an empty stomach. 09/12/23   Adella Norris, MD  amLODipine  (NORVASC ) 10 MG tablet TAKE 1 TABLET BY MOUTH ONCE DAILY WITH MORNING MEAL 09/12/23   Adella Norris, MD  Calcium  Citrate 250 MG TABS Take 2 tablets (500 mg total) by mouth 2 (two) times daily. 12/12/22   Adella Norris, MD  Cholecalciferol (VITAMIN D3) 25 MCG (1000 UT) CAPS Take 1 capsule (1,000 Units total) by mouth daily. 12/12/22   Adella Norris, MD  esomeprazole  (NEXIUM ) 40 MG capsule Take 1 capsule (40 mg total) by mouth daily at 12 noon. 03/26/24 09/22/24  [place], PA-C  fluocinonide  (LIDEX ) 0.05 % external solution Apply to affected areas of scalp twice daily as needed. Patient not taking: Reported on 07/29/2024 09/12/23   Adella Norris, MD  losartan  (COZAAR ) 100 MG tablet Take 1 tablet (100 mg total) by mouth daily.  09/12/23   Adella Norris, MD  metoprolol  succinate (TOPROL -XL) 25 MG 24 hr tablet TAKE 1 TABLET BY MOUTH ONCE DAILY WITH EVENING MEAL 09/12/23   Adella Norris, MD  triamcinolone  cream (KENALOG ) 0.1 % Apply to affected area twice daily as needed. Patient not taking: Reported on 07/29/2024 12/09/22   Adella Norris, MD    Family History Family History  Problem Relation Age of Onset   GER disease Mother    Migraines Father    Thyroid  disease Sister        describe a very large goiter   Migraines Brother    Hypertension Brother    Kidney disease Brother    Mental illness Brother        He disappeared--do not know if alive   Diabetes Daughter    Anemia  Daughter     Social History Social History   Tobacco Use   Smoking status: Never   Smokeless tobacco: Never  Vaping Use   Vaping status: Never Used  Substance Use Topics   Alcohol use: No   Drug use: No     Allergies   Patient has no known allergies.   Review of Systems Review of Systems  Skin:  Positive for rash.     Physical Exam Triage Vital Signs ED Triage Vitals  Encounter Vitals Group     BP 07/29/24 0909 (!) 157/66     Girls Systolic BP Percentile --      Girls Diastolic BP Percentile --      Boys Systolic BP Percentile --      Boys Diastolic BP Percentile --      Pulse Rate 07/29/24 0909 60     Resp 07/29/24 0909 18     Temp 07/29/24 0909 (!) 97.4 F (36.3 C)     Temp Source 07/29/24 0909 Oral     SpO2 07/29/24 0909 99 %     Weight --      Height --      Head Circumference --      Peak Flow --      Pain Score 07/29/24 0910 0     Pain Loc --      Pain Education --      Exclude from Growth Chart --    No data found.  Updated Vital Signs BP (!) 157/66 (BP Location: Right Arm)   Pulse 60   Temp (!) 97.4 F (36.3 C) (Oral)   Resp 18   SpO2 99%   Visual Acuity Right Eye Distance:   Left Eye Distance:   Bilateral Distance:    Right Eye Near:   Left Eye Near:    Bilateral Near:     Physical Exam Vitals and nursing note reviewed.  Constitutional:      General: She is not in acute distress.    Appearance: Normal appearance. She is not ill-appearing.  Skin:    Findings: Rash present. Rash is macular and papular.     Comments: Diffuse maculopapular rash noted to upper and lower extremities, chest, abdominal wall, and back.  Neurological:     Mental Status: She is alert.      UC Treatments / Results  Labs (all labs ordered are listed, but only abnormal results are displayed) Labs Reviewed - No data to display  EKG   Radiology No results found.  Procedures Procedures (including critical care time)  Medications Ordered in  UC Medications - No data to display  Initial Impression / Assessment and Plan /  UC Course  I have reviewed the triage vital signs and the nursing notes.  Pertinent labs & imaging results that were available during my care of the patient were reviewed by me and considered in my medical decision making (see chart for details).     Patient is overall well-appearing.  Vitals are stable.  Findings consistent with contact dermatitis versus eczema.  Prescribed prednisone  Dosepak.  Discussed with patient the importance of continuing her alendronate  while taking this.  Discussed follow-up and return precautions. Final Clinical Impressions(s) / UC Diagnoses   Final diagnoses:  Rash and nonspecific skin eruption     Discharge Instructions      Start taking the prednisone  Dosepak as instructed to on for weeks. Be sure that you are still taking your alendronate  while taking this. Follow-up with your primary care provider or return here as needed.     ED Prescriptions     Medication Sig Dispense Auth. Provider   predniSONE  (STERAPRED UNI-PAK 21 TAB) 10 MG (21) TBPK tablet Take by mouth daily. Take 6 tabs by mouth daily  for 2 days, then 5 tabs for 2 days, then 4 tabs for 2 days, then 3 tabs for 2 days, 2 tabs for 2 days, then 1 tab by mouth daily for 2 days 42 tablet Johnie Flaming A, NP      PDMP not reviewed this encounter.   Johnie Flaming LABOR, NP 07/29/24 9047    Johnie Flaming LABOR, NP 07/29/24 [phone]

## 2024-07-29 NOTE — Discharge Instructions (Signed)
 Start taking the prednisone  Dosepak as instructed to on for weeks. Be sure that you are still taking your alendronate  while taking this. Follow-up with your primary care provider or return here as needed.

## 2024-08-15 ENCOUNTER — Ambulatory Visit: Payer: Self-pay | Admitting: Internal Medicine

## 2024-08-15 ENCOUNTER — Encounter: Payer: Self-pay | Admitting: Internal Medicine

## 2024-08-15 VITALS — BP 117/60 | HR 78 | Resp 18 | Ht 60.0 in | Wt 102.0 lb

## 2024-08-15 DIAGNOSIS — Z23 Encounter for immunization: Secondary | ICD-10-CM

## 2024-08-15 DIAGNOSIS — M810 Age-related osteoporosis without current pathological fracture: Secondary | ICD-10-CM

## 2024-08-15 DIAGNOSIS — Z0000 Encounter for general adult medical examination without abnormal findings: Secondary | ICD-10-CM

## 2024-08-15 DIAGNOSIS — Z1231 Encounter for screening mammogram for malignant neoplasm of breast: Secondary | ICD-10-CM

## 2024-08-15 MED ORDER — LORATADINE 10 MG PO TABS: 10.0000 mg | ORAL_TABLET | Freq: Every day | ORAL | Status: AC

## 2024-08-15 NOTE — Progress Notes (Signed)
 Subjective:    Patient ID: Laura Harris, female   DOB: [birth year], 68 y.o.   MRN: [id number]   HPI  Daughter, Austin, interprets  CPE without pap  1.  Pap:  Last 2022 and normal.  Only pap smear she has had.  Stable female partner (married)--not clear if they continue to be sexually  2.  Mammogram:  Has not obtained with the last 3 orders.  Daughter, Austin states to have them call her phone.  Medicaid is currently expired.    3.  Osteoprevention:  Diagnosed via DEXA in 2023 with osteoporosis.  She is taking what sounds like adequate Calcium  and Vitamin D .  Walks on treadmill for 2 hours daily.  Gardens outside for 1-2 hours.  4.  Guaiac Cards/FIT:  Last in 2023 and negative for blood  5.  Colonoscopy:  Last in 2023 with Dr Aneita without polyps.  Due in 2033.  Did have EGD in 04/2024 with reactive gastropathy done for abdominal pain and anemia after return from trip to [place] earlier in year.  She is not taking Nexium  since GI evaluation/EGD in June.  H. Pylori negative.    6.  Immunizations:  Due for Shingrix, pneumococcal, Td, influenza and COVID Somewhat resistant to getting any vaccines, but agrees to influenza today.    Immunization History  Administered Date(s) Administered   Hepatitis B, ADULT 04/13/2011   Influenza Inj Mdck Quad Pf 10/01/2021   Influenza, Mdck, Trivalent,PF 6+ MOS(egg free) 09/12/2023   Influenza, Seasonal, Injecte, Preservative Fre 04/07/2010, 04/13/2011   Influenza,inj,Quad PF,6+ Mos 02/01/2016   Influenza-Unspecified 09/19/2020   MMR 11/25/2009   Moderna Covid-19 Fall Seasonal Vaccine 58yrs & older 09/12/2023   Moderna Sars-Covid-2 Vaccination 10/01/2021   PFIZER Comirnaty(Gray Top)Covid-19 Tri-Sucrose Vaccine 09/10/2021   Td (Adult),unspecified 11/25/2009   Tdap 04/13/2011     7.  Glucose/Cholesterol:  History of prediabetes with glucose and A1C normal in recent past.  Mildly high cholesterol with good HDL and slightly high LDL  Current Meds  Medication  Sig   acetaminophen  (TYLENOL ) 325 MG tablet Take 650 mg by mouth every 6 (six) hours as needed for headache or mild pain (pain score 1-3).   alendronate  (FOSAMAX ) 70 MG tablet TAKE 1 TABLET(70 MG) BY MOUTH EVERY 7 DAYS WITH A FULL GLASS OF WATER AND ON AN EMPTY STOMACH   amLODipine  (NORVASC ) 10 MG tablet TAKE 1 TABLET BY MOUTH ONCE DAILY WITH MORNING MEAL   Calcium  Citrate 250 MG TABS Take 2 tablets (500 mg total) by mouth 2 (two) times daily.   Cholecalciferol (VITAMIN D3) 25 MCG (1000 UT) CAPS Take 1 capsule (1,000 Units total) by mouth daily.   esomeprazole  (NEXIUM ) 40 MG capsule Take 1 capsule (40 mg total) by mouth daily at 12 noon.   losartan  (COZAAR ) 100 MG tablet Take 1 tablet (100 mg total) by mouth daily.   metoprolol  succinate (TOPROL -XL) 25 MG 24 hr tablet TAKE 1 TABLET BY MOUTH ONCE DAILY WITH EVENING MEAL   predniSONE  (STERAPRED UNI-PAK 21 TAB) 10 MG (21) TBPK tablet Take by mouth daily. 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   Past Surgical History:  Procedure Laterality Date   NO PAST SURGERIES     Family History  Problem Relation Age of Onset   GER disease Mother    Migraines Father    Thyroid  disease Sister        describe a very large goiter   Migraines Brother    Hypertension Brother    Kidney disease Brother    Mental illness Brother        He disappeared--do not know if alive   Diabetes Daughter    Anemia Daughter    Social History   Socioeconomic History   Marital status: Married    Spouse name: My Rahlan   Number of children: 8   Years of education: 0   Highest  education level: Not on file  Occupational History   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  Husband was refugee and sponsored her arrival.   Lives at home with husband, 1 daughter, 1 son     Men in family often do not live there as they are roofers and travel around a lot   Language:   - Jarai/Montagnard    - Requires intepreter (essentially speaks no [place])      Education:   - No formal education in [place]                        Social Drivers of Health   Financial Resource Strain: Low Risk  (08/15/2024)   Overall Financial Resource Strain (CARDIA)    Difficulty of Paying Living Expenses: Not hard at all  Food Insecurity: No Food Insecurity (08/15/2024)   Hunger Vital Sign    Worried About Running Out of Food in the Last Year: Never true    Ran Out of Food in the Last Year: Never true  Transportation Needs: No Transportation Needs (08/15/2024)   PRAPARE - Administrator, Civil Service (Medical): No    Lack of Transportation (Non-Medical): No  Physical Activity: Not on file  Stress: No Stress Concern Present (08/28/2020)   [employer] of Occupational Health - Occupational Stress Questionnaire    Feeling of Stress : Not at all  Social Connections: Moderately Integrated (01/09/2024)   Social Connection and Isolation Panel    Frequency of Communication with Friends and Family: More than three times a week    Frequency of Social Gatherings with Friends and Family: More than three times a week    Attends Religious Services: More than 4 times per year    Active Member of  [employer] or Organizations: No    Attends Banker Meetings: Never    Marital Status: Married  Catering manager Violence: Not At Risk (08/15/2024)   Humiliation, Afraid, Rape, and Kick questionnaire    Fear of Current or Ex-Partner: No    Emotionally Abused: No  Physically Abused: No    Sexually Abused: No         Review of Systems  Cardiovascular:  Positive for chest pain (Right lower sternal area.  Burning.  Then points to LUQ as well.  She is not taking the Nexium .  Can happen at any time.  Has had the pain for 25 + years.  No signfiicant change over the years.).      Objective:   BP 117/60 (BP Location: Left Arm, Patient Position: Sitting, Cuff Size: Normal)   Pulse 78   Resp 18   Ht 5' (1.524 m)   Wt 102 lb (46.3 kg)   BMI 19.92 kg/m   Physical Exam Constitutional:      Appearance: She is normal weight.  HENT:     Head: Normocephalic and atraumatic.     Right Ear: Tympanic membrane, ear canal and external ear normal.     Left Ear: Tympanic membrane, ear canal and external ear normal.     Nose: Nose normal.     Mouth/Throat:     Mouth: Mucous membranes are moist.     Pharynx: Oropharynx is clear.  Eyes:     Extraocular Movements: Extraocular movements intact.     Conjunctiva/sclera: Conjunctivae normal.     Pupils: Pupils are equal, round, and reactive to light.     Comments: Discs sharp  Neck:     Thyroid : No thyroid  mass or thyromegaly.  Cardiovascular:     Rate and Rhythm: Normal rate and regular rhythm.     Heart sounds: S1 normal and S2 normal. No murmur heard.    No friction rub. No S3 or S4 sounds.     Comments: No carotid bruits.  Carotid, radial, femoral, DP and PT pulses normal and equal.   Pulmonary:     Effort: Pulmonary effort is normal.     Breath sounds: Normal breath sounds and air entry.  Chest:  Breasts:    Right: No inverted nipple, mass or nipple discharge.     Left: No inverted nipple, mass or nipple discharge.   Abdominal:     General: Abdomen is flat. Bowel sounds are normal.     Palpations: Abdomen is soft. There is no hepatomegaly, splenomegaly or mass.     Tenderness: There is no abdominal tenderness.     Hernia: No hernia is present.  Genitourinary:    General: Normal vulva.     Comments: No uterine or adnexal mass or tenderness. Musculoskeletal:        General: Normal range of motion.     Cervical back: Normal range of motion and neck supple.     Right lower leg: No edema.     Left lower leg: No edema.  Feet:     Right foot:     Skin integrity: Skin integrity normal.     Left foot:     Skin integrity: Skin integrity normal.  Lymphadenopathy:     Head:     Right side of head: No submental or submandibular adenopathy.     Left side of head: No submental or submandibular adenopathy.     Cervical: No cervical adenopathy.     Upper Body:     Right upper body: No supraclavicular or axillary adenopathy.     Left upper body: No supraclavicular or axillary adenopathy.     Lower Body: No right inguinal adenopathy. No left inguinal adenopathy.  Skin:    General: Skin is warm.     Capillary  Refill: Capillary refill takes less than 2 seconds.     Findings: No rash.  Neurological:     General: No focal deficit present.     Mental Status: She is alert and oriented to person, place, and time.     Cranial Nerves: Cranial nerves 2-12 are intact.     Sensory: Sensation is intact.     Motor: Motor function is intact.     Coordination: Coordination is intact.     Gait: Gait is intact.     Deep Tendon Reflexes: Reflexes are normal and symmetric.  Psychiatric:        Mood and Affect: Mood normal.        Speech: Speech normal.        Behavior: Behavior normal. Behavior is cooperative.      Assessment & Plan   CPE without pap Mammogram FIT to return in 2 weeks. Fluad influenza vaccine. Declines other recommended vaccines.  2.  Epigastric pain:  encouraged use of PPI regularly.  Symptoms  no worse since starting alendronate .  Went over GERD precautions as well.  3.  Osteoporosis:  DEXA repeat.

## 2024-08-20 NOTE — Telephone Encounter (Signed)
 Patient has been seen.

## 2024-08-23 ENCOUNTER — Other Ambulatory Visit: Payer: Self-pay | Admitting: Internal Medicine

## 2024-09-02 ENCOUNTER — Other Ambulatory Visit: Payer: Self-pay

## 2024-09-06 ENCOUNTER — Other Ambulatory Visit: Payer: Self-pay | Admitting: Internal Medicine

## 2024-09-11 ENCOUNTER — Encounter: Payer: Self-pay | Admitting: Internal Medicine

## 2024-09-14 ENCOUNTER — Ambulatory Visit (INDEPENDENT_AMBULATORY_CARE_PROVIDER_SITE_OTHER)

## 2024-09-14 ENCOUNTER — Ambulatory Visit (HOSPITAL_COMMUNITY)
Admission: EM | Admit: 2024-09-14 | Discharge: 2024-09-14 | Disposition: A | Discharge status: Home / Self Care | Attending: Emergency Medicine | Admitting: Emergency Medicine

## 2024-09-14 ENCOUNTER — Other Ambulatory Visit: Payer: Self-pay

## 2024-09-14 ENCOUNTER — Encounter (HOSPITAL_COMMUNITY): Payer: Self-pay | Admitting: *Deleted

## 2024-09-14 DIAGNOSIS — R1013 Epigastric pain: Secondary | ICD-10-CM

## 2024-09-14 DIAGNOSIS — K5900 Constipation, unspecified: Secondary | ICD-10-CM

## 2024-09-14 MED ORDER — POLYETHYLENE GLYCOL 3350 17 G PO PACK: 17.0000 g | PACK | Freq: Every day | ORAL | 0 refills | Status: AC

## 2024-09-14 MED ORDER — ESOMEPRAZOLE MAGNESIUM 40 MG PO CPDR: 40.0000 mg | DELAYED_RELEASE_CAPSULE | Freq: Every day | ORAL | 0 refills | Status: AC

## 2024-09-14 NOTE — Discharge Instructions (Signed)
 Start taking MiraLAX  once daily to help soften the stool and help with constipation. Make sure you are drinking lots of water with this breath to be the most effective. Take Nexium  once daily to help with epigastric pain and indigestion. Follow-up with Ellouise Console who is the gastroenterology PA that you have seen previously for similar issues. Otherwise follow-up with primary care provider or return as needed. If you develop worsening abdominal pain, excessive vomiting, excessive diarrhea, blood in vomit or stool, or high fevers please seek immediate medical treatment in the emergency department.

## 2024-09-14 NOTE — ED Provider Notes (Signed)
 [facility]    CSN: [id number] Arrival date & time: 09/14/24  1149      History   Chief Complaint Chief Complaint  Patient presents with   Abdominal Pain    HPI Laura Harris is a 68 y.o. female.   Patient presents with left lower quadrant pain and epigastric pain that mainly occurs when laying down at night, but also occurs periodically throughout the day.  Patient states that she will have a significant abdominal cramping when she is attempting to have a bowel movement.    Patient states that when she has a bowel movement only a little amount comes out and it is difficult to pass.  Patient denies any blood in stool.  Patient denies any nausea, vomiting, and diarrhea.  Patient reports that she is still passing flatus.  Patient reports last BM was this morning and was difficult to pass and a small amount.  Patient does have a history of chronic constipation.  Patient states that she does not take medications daily for any of her chronic symptoms.  Daughter reports that she did take a over-the-counter medication like Tums with minimal relief.  Patient has seen gastroenterology in the past for similar issues and was prescribed Nexium  at that time.  Patient states that she ran out of this and has not been taking this since running out.  The history is limited by a language barrier. No language interpreter was used (Declined interpreter, daughter is interpreting).  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      Home Medications    Prior to Admission medications   Medication Sig Start Date End Date Taking? Authorizing Provider  alendronate  (FOSAMAX ) 70 MG tablet TAKE 1 TABLET(70 MG) BY MOUTH EVERY 7 DAYS WITH A FULL GLASS OF WATER AND ON AN EMPTY STOMACH 09/11/24  Yes Adella Norris, MD  amLODipine  (NORVASC ) 10 MG tablet TAKE 1 TABLET BY MOUTH ONCE DAILY WITH MORNING MEAL 09/12/23  Yes Adella Norris, MD  esomeprazole  (NEXIUM ) 40 MG capsule Take 1 capsule (40 mg total) by mouth daily. 09/14/24  Yes Johnie, Raelynne Ludwick A, NP  losartan  (COZAAR ) 100 MG tablet Take 1 tablet (100 mg total) by mouth daily. 09/12/23  Yes Adella Norris, MD  metoprolol  succinate (TOPROL -XL) 25 MG 24 hr tablet TAKE 1 TABLET BY MOUTH ONCE DAILY WITH EVENING MEAL 09/12/23  Yes Adella Norris, MD  polyethylene glycol (MIRALAX ) 17 g packet Take 17 g by mouth daily. 09/14/24  Yes Johnie, Conchetta Lamia A, NP  Calcium  Citrate 250 MG TABS Take 2 tablets (500 mg total) by mouth  2 (two) times daily. 12/12/22   Adella Norris, MD  Cholecalciferol (VITAMIN D3) 25 MCG (1000 UT) CAPS Take 1 capsule (1,000 Units total) by mouth daily. 12/12/22   Adella Norris, MD  esomeprazole  (NEXIUM ) 40 MG capsule Take 1 capsule (40 mg total) by mouth daily at 12 noon. 03/26/24 09/22/24  [place], PA-C  fluocinonide  (LIDEX ) 0.05 % external solution Apply to affected areas of scalp twice daily as needed. Patient not taking: Reported on 07/29/2024 09/12/23   Adella Norris, MD  loratadine  (CLARITIN ) 10 MG tablet Take 1 tablet (10 mg total) by mouth daily. 08/15/24   Adella Norris, MD    Family History Family History  Problem Relation Age of Onset   GER disease Mother    Migraines Father    Thyroid  disease Sister        describe a very large goiter   Migraines Brother    Hypertension Brother    Kidney disease Brother    Mental illness Brother        He disappeared--do not know if alive   Diabetes Daughter    Anemia Daughter     Social History Social History   Tobacco Use   Smoking status: Never    Passive exposure: Never   Smokeless tobacco: Never  Vaping Use   Vaping status: Never Used  Substance Use Topics   Alcohol use: No   Drug use: No     Allergies   Patient has no known allergies.   Review of Systems Review of Systems  Gastrointestinal:  Positive for abdominal pain.   Per HPI  Physical Exam Triage Vital Signs ED Triage Vitals  Encounter Vitals Group     BP 09/14/24 1220 123/75     Girls Systolic BP Percentile --      Girls Diastolic BP Percentile --      Boys Systolic BP Percentile --      Boys Diastolic BP Percentile --      Pulse Rate 09/14/24 1220 63     Resp 09/14/24 1220 18     Temp 09/14/24 1220 98.3 F (36.8 C)     Temp src --      SpO2 09/14/24 1220 98 %     Weight --      Height --      Head Circumference --      Peak Flow --      Pain Score 09/14/24 1215 8     Pain Loc --      Pain Education --      Exclude from Growth Chart --    No data found.  Updated Vital Signs BP 123/75   Pulse 63   Temp 98.3 F (36.8 C)   Resp 18   SpO2 98%   Visual Acuity Right Eye Distance:   Left Eye Distance:   Bilateral Distance:    Right Eye Near:   Left Eye Near:    Bilateral Near:     Physical Exam Vitals and nursing note reviewed.  Constitutional:      General: She is awake. She is not in acute distress.    Appearance: Normal appearance. She is well-developed and well-groomed. She is not ill-appearing.  Abdominal:     General: Abdomen is flat. Bowel sounds are normal.     Palpations: Abdomen  is soft.     Tenderness: There is abdominal tenderness in the epigastric area and left lower quadrant. There is no guarding or rebound. Negative signs include  Murphy's sign.     Hernia: No hernia is present.  Skin:    General: Skin is warm and dry.  Neurological:     Mental Status: She is alert.  Psychiatric:        Behavior: Behavior is cooperative.      UC Treatments / Results  Labs (all labs ordered are listed, but only abnormal results are displayed) Labs Reviewed - No data to display  EKG   Radiology DG Abd 1 View Result Date: 09/14/2024 EXAM: 1 VIEW XRAY OF THE ABDOMEN 09/14/2024 12:56:16 PM COMPARISON: None available. CLINICAL HISTORY: epigastric and LLQ pain, constipation. Table formatting from the original note was not included. PT reports ABD pain mostly at night for one week. Pt denies any N/V/D. Pt is eating as normal. Family reports Pt has taken some type of stomach med. But name is unknown. PT reports her stomach has been making a lot of noise like when she is constipated. Pt last BM this morning. FINDINGS: BOWEL: Nonobstructive bowel gas pattern. SOFT TISSUES: No opaque urinary calculi. BONES: No acute osseous abnormality. IMPRESSION: 1. No bowel obstruction. Electronically signed by: Waddell Calk MD 09/14/2024 12:59 PM EDT RP Workstation: [id number]    Procedures Procedures (including critical care time)  Medications Ordered in UC Medications - No data to display  Initial Impression / Assessment and Plan / UC Course  I have reviewed the triage vital signs and the nursing notes.  Pertinent labs & imaging results that were available during my care of the patient were reviewed by me and considered in my medical decision making (see chart for details).     Patient is overall well-appearing.  Vitals are stable.  Abdominal x-ray ordered.  I independently interpreted these images and there is some stool present with normal gas pattern and no obstruction noted.   Radiology report confirms this.  Prescribed MiraLAX  to help with constipation.  Prescribed Nexium  to take daily for epigastric pain and indigestion.  Recommended following up with GI for further evaluation of ongoing symptoms.  Discussed follow-up, return, and strict ER precautions. Final Clinical Impressions(s) / UC Diagnoses   Final diagnoses:  Constipation, unspecified constipation type  Epigastric pain     Discharge Instructions      Start taking MiraLAX  once daily to help soften the stool and help with constipation. Make sure you are drinking lots of water with this breath to be the most effective. Take Nexium  once daily to help with epigastric pain and indigestion. Follow-up with Ellouise Console who is the gastroenterology PA that you have seen previously for similar issues. Otherwise follow-up with primary care provider or return as needed. If you develop worsening abdominal pain, excessive vomiting, excessive diarrhea, blood in vomit or stool, or high fevers please seek immediate medical treatment in the emergency department.     ED Prescriptions     Medication Sig Dispense Auth. Provider   esomeprazole  (NEXIUM ) 40 MG capsule Take 1 capsule (40 mg total) by mouth daily. 30 capsule Johnie Flaming A, NP   polyethylene glycol (MIRALAX ) 17 g packet Take 17 g by mouth daily. 14 each Johnie Flaming LABOR, NP      PDMP not reviewed this encounter.   Johnie Flaming A, NP 09/14/24 1321

## 2024-09-14 NOTE — ED Triage Notes (Addendum)
 PT reports ABD pain mostly at night for one week. Pt denies any N/V/D. Pt is eating as normal. Family reports Pt has taken some type of stomach med. But name is unknown. PT reports her stomach has been making a lot of noise like when she is constipated. Pt last BM this morning.

## 2024-09-24 ENCOUNTER — Other Ambulatory Visit: Payer: Self-pay | Admitting: Internal Medicine

## 2024-09-25 ENCOUNTER — Other Ambulatory Visit: Payer: Self-pay | Admitting: Internal Medicine

## 2024-09-25 MED ORDER — METOPROLOL SUCCINATE ER 25 MG PO TB24
ORAL_TABLET | ORAL | 3 refills | Status: AC
Start: 2024-09-25 — End: ?

## 2024-10-10 NOTE — Telephone Encounter (Signed)
 Called patient's daughter , no answer and unable to lvm

## 2024-11-04 NOTE — Telephone Encounter (Signed)
 Called patient no answer and unable to lvm 11/04/24.

## 2024-11-08 ENCOUNTER — Ambulatory Visit: Payer: Self-pay | Admitting: Nurse Practitioner

## 2024-11-08 NOTE — Progress Notes (Deleted)
 "    11/08/2024 Laura Harris [id number] 07-07-1956   Chief Complaint:  History of Present Illness: Laura Harris is a 68 year old female with a past medical history of hypertension, hyperlipidemia, anemia, CKD and GERD. Dr. Stacia is her assigned GI. She was initially seen in office by Ellouise Console PA-C 03/26/2024 for further evaluation regarding anemia and epigastric pain.    Discussed the use of AI scribe software for clinical note transcription with the patient, who gave verbal consent to proceed.  History of Present Illness     Latest Ref Rng & Units 03/26/2024   11:34 AM 02/19/2024   10:34 AM 01/18/2024   12:28 PM  CBC  WBC 4.0 - 10.5 K/uL 3.8   4.8   Hemoglobin 12.0 - 15.0 g/dL 87.9   9.7   Hematocrit 36.0 - 46.0 % 38.4  34.2  30.6   Platelets 150.0 - 400.0 K/uL 186.0   203        Latest Ref Rng & Units 01/18/2024   12:28 PM 01/10/2024    4:48 AM 01/09/2024   12:15 PM  CMP  Glucose 70 - 99 mg/dL 87  895  96   BUN 8 - 27 mg/dL 11  9  6    Creatinine 0.57 - 1.00 mg/dL 8.99  8.83  8.36   Sodium 134 - 144 mmol/L 140  137  136   Potassium 3.5 - 5.2 mmol/L 4.2  4.2  4.1   Chloride 96 - 106 mmol/L 101  106  103   CO2 20 - 29 mmol/L 25  23  24    Calcium  8.7 - 10.3 mg/dL 9.1  8.1  8.4   Total Protein 6.0 - 8.5 g/dL 6.9     Total Bilirubin 0.0 - 1.2 mg/dL 0.4     Alkaline Phos 44 - 121 IU/L 42     AST 0 - 40 IU/L 18     ALT 0 - 32 IU/L 13       EGD 04/25/2024: - The examined portions of the nasopharynx, oropharynx and larynx were normal.  - Normal esophagus.  - Normal stomach. Biopsied.  - Normal examined duodenum.  - No endoscopic abnormalities to explain chronic epigastric pain.  Colonoscopy 07/26/2022: - Internal hemorrhoids.  - The examination was otherwise normal on direct and retroflexion views.  - No specimens collected. 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   Review of Systems:   Constitutional: Negative for fever, sweats, chills or weight loss.  Respiratory: Negative for shortness of breath.   Cardiovascular: Negative for chest pain, palpitations and leg swelling.  Gastrointestinal: See HPI.  Musculoskeletal: Negative for back pain or muscle aches.  Neurological: Negative for dizziness, headaches or paresthesias.    Physical Exam: There were no vitals taken for this visit. General: in no acute distress. Head: Normocephalic and atraumatic. Eyes: No scleral icterus. Conjunctiva pink . Ears: Normal auditory acuity. Mouth: Dentition intact. No ulcers or lesions.  Lungs: Clear throughout to auscultation. Heart: Regular rate and rhythm, no murmur. Abdomen: Soft, nontender and nondistended. No masses or hepatomegaly. Normal bowel sounds x 4 quadrants.  Rectal: *** Musculoskeletal: Symmetrical with no gross deformities. Extremities: No edema. Neurological: Alert oriented x 4. No focal deficits.  Psychological: Alert and cooperative. Normal mood and affect  Assessment and Recommendations: ***  "

## 2025-02-17 ENCOUNTER — Other Ambulatory Visit: Payer: Self-pay

## 2025-02-24 ENCOUNTER — Ambulatory Visit: Payer: Self-pay | Admitting: Internal Medicine

## 2025-08-18 ENCOUNTER — Other Ambulatory Visit: Payer: Self-pay | Admitting: Internal Medicine

## 2025-08-25 ENCOUNTER — Encounter: Payer: Self-pay | Admitting: Internal Medicine
